# Patient Record
Sex: Female | Born: 1960 | Race: White | Hispanic: No | Smoking: Former smoker
Health system: Southern US, Community
[De-identification: ages and names within clinical notes are randomized; demographics above are authoritative.]

## PROBLEM LIST (undated history)

## (undated) DIAGNOSIS — T7840XA Allergy, unspecified, initial encounter: Secondary | ICD-10-CM

## (undated) DIAGNOSIS — M1990 Unspecified osteoarthritis, unspecified site: Secondary | ICD-10-CM

## (undated) DIAGNOSIS — N200 Calculus of kidney: Secondary | ICD-10-CM

## (undated) DIAGNOSIS — N390 Urinary tract infection, site not specified: Secondary | ICD-10-CM

## (undated) HISTORY — DX: Unspecified osteoarthritis, unspecified site: M19.90

## (undated) HISTORY — DX: Allergy, unspecified, initial encounter: T78.40XA

## (undated) HISTORY — PX: EYE SURGERY: SHX253

---

## 1998-08-27 ENCOUNTER — Ambulatory Visit (HOSPITAL_COMMUNITY): Admission: RE | Admit: 1998-08-27 | Discharge: 1998-08-27 | Payer: Self-pay | Admitting: Family Medicine

## 2000-06-02 ENCOUNTER — Ambulatory Visit (HOSPITAL_COMMUNITY): Admission: RE | Admit: 2000-06-02 | Discharge: 2000-06-02 | Payer: Self-pay | Admitting: Family Medicine

## 2000-06-02 ENCOUNTER — Encounter: Payer: Self-pay | Admitting: Family Medicine

## 2000-07-07 ENCOUNTER — Ambulatory Visit (HOSPITAL_COMMUNITY): Admission: RE | Admit: 2000-07-07 | Discharge: 2000-07-07 | Payer: Self-pay | Admitting: Family Medicine

## 2000-12-17 ENCOUNTER — Encounter: Admission: RE | Admit: 2000-12-17 | Discharge: 2000-12-17 | Payer: Self-pay | Admitting: Obstetrics

## 2001-03-18 ENCOUNTER — Encounter: Admission: RE | Admit: 2001-03-18 | Discharge: 2001-03-18 | Payer: Self-pay | Admitting: Obstetrics

## 2002-04-19 ENCOUNTER — Encounter: Admission: RE | Admit: 2002-04-19 | Discharge: 2002-04-19 | Payer: Self-pay | Admitting: Family Medicine

## 2002-04-19 ENCOUNTER — Encounter: Payer: Self-pay | Admitting: Family Medicine

## 2002-06-23 ENCOUNTER — Other Ambulatory Visit: Admission: RE | Admit: 2002-06-23 | Discharge: 2002-06-23 | Payer: Self-pay | Admitting: Family Medicine

## 2003-10-11 ENCOUNTER — Other Ambulatory Visit: Admission: RE | Admit: 2003-10-11 | Discharge: 2003-10-11 | Payer: Self-pay | Admitting: Family Medicine

## 2003-12-27 ENCOUNTER — Emergency Department (HOSPITAL_COMMUNITY): Admission: EM | Admit: 2003-12-27 | Discharge: 2003-12-27 | Payer: Self-pay | Admitting: Emergency Medicine

## 2004-01-16 ENCOUNTER — Ambulatory Visit: Payer: Self-pay | Admitting: Family Medicine

## 2004-01-25 ENCOUNTER — Ambulatory Visit (HOSPITAL_COMMUNITY): Admission: RE | Admit: 2004-01-25 | Discharge: 2004-01-25 | Payer: Self-pay | Admitting: Family Medicine

## 2004-02-08 ENCOUNTER — Ambulatory Visit: Payer: Self-pay | Admitting: Family Medicine

## 2004-03-31 HISTORY — PX: COSMETIC SURGERY: SHX468

## 2004-04-22 ENCOUNTER — Encounter: Admission: RE | Admit: 2004-04-22 | Discharge: 2004-05-06 | Payer: Self-pay | Admitting: Chiropractic Medicine

## 2005-02-05 ENCOUNTER — Encounter: Admission: RE | Admit: 2005-02-05 | Discharge: 2005-04-29 | Payer: Self-pay | Admitting: Neurosurgery

## 2005-07-22 ENCOUNTER — Encounter: Admission: RE | Admit: 2005-07-22 | Discharge: 2005-08-28 | Payer: Self-pay | Admitting: Neurosurgery

## 2005-09-04 ENCOUNTER — Encounter: Admission: RE | Admit: 2005-09-04 | Discharge: 2005-10-30 | Payer: Self-pay | Admitting: *Deleted

## 2005-09-09 ENCOUNTER — Ambulatory Visit (HOSPITAL_COMMUNITY): Admission: RE | Admit: 2005-09-09 | Discharge: 2005-09-09 | Payer: Self-pay

## 2005-12-24 ENCOUNTER — Ambulatory Visit: Payer: Self-pay | Admitting: Family Medicine

## 2005-12-30 ENCOUNTER — Ambulatory Visit (HOSPITAL_COMMUNITY): Admission: RE | Admit: 2005-12-30 | Discharge: 2005-12-30 | Payer: Self-pay | Admitting: Family Medicine

## 2006-02-17 ENCOUNTER — Ambulatory Visit: Payer: Self-pay | Admitting: Family Medicine

## 2006-03-27 ENCOUNTER — Encounter (INDEPENDENT_AMBULATORY_CARE_PROVIDER_SITE_OTHER): Payer: Self-pay | Admitting: Family Medicine

## 2006-03-27 ENCOUNTER — Ambulatory Visit: Payer: Self-pay | Admitting: Family Medicine

## 2006-12-16 ENCOUNTER — Encounter (INDEPENDENT_AMBULATORY_CARE_PROVIDER_SITE_OTHER): Payer: Self-pay | Admitting: *Deleted

## 2007-09-12 ENCOUNTER — Emergency Department (HOSPITAL_COMMUNITY): Admission: EM | Admit: 2007-09-12 | Discharge: 2007-09-12 | Payer: Self-pay | Admitting: Emergency Medicine

## 2008-12-16 ENCOUNTER — Emergency Department (HOSPITAL_COMMUNITY): Admission: EM | Admit: 2008-12-16 | Discharge: 2008-12-16 | Payer: Self-pay | Admitting: Emergency Medicine

## 2008-12-20 ENCOUNTER — Emergency Department (HOSPITAL_COMMUNITY): Admission: EM | Admit: 2008-12-20 | Discharge: 2008-12-20 | Payer: Self-pay | Admitting: Emergency Medicine

## 2010-09-16 ENCOUNTER — Other Ambulatory Visit: Payer: Self-pay | Admitting: Internal Medicine

## 2010-09-16 DIAGNOSIS — Z1231 Encounter for screening mammogram for malignant neoplasm of breast: Secondary | ICD-10-CM

## 2010-09-17 ENCOUNTER — Ambulatory Visit
Admission: RE | Admit: 2010-09-17 | Discharge: 2010-09-17 | Disposition: A | Discharge status: Home / Self Care | Payer: Self-pay | Source: Ambulatory Visit | Attending: Internal Medicine | Admitting: Internal Medicine

## 2010-09-17 DIAGNOSIS — Z1231 Encounter for screening mammogram for malignant neoplasm of breast: Secondary | ICD-10-CM

## 2010-09-20 ENCOUNTER — Other Ambulatory Visit: Payer: Self-pay | Admitting: Internal Medicine

## 2010-09-20 DIAGNOSIS — R928 Other abnormal and inconclusive findings on diagnostic imaging of breast: Secondary | ICD-10-CM

## 2010-09-27 ENCOUNTER — Ambulatory Visit
Admission: RE | Admit: 2010-09-27 | Discharge: 2010-09-27 | Disposition: A | Discharge status: Home / Self Care | Payer: Self-pay | Source: Ambulatory Visit | Attending: Internal Medicine | Admitting: Internal Medicine

## 2010-09-27 DIAGNOSIS — R928 Other abnormal and inconclusive findings on diagnostic imaging of breast: Secondary | ICD-10-CM

## 2012-06-10 ENCOUNTER — Ambulatory Visit: Payer: BC Managed Care – PPO

## 2012-06-10 ENCOUNTER — Ambulatory Visit (INDEPENDENT_AMBULATORY_CARE_PROVIDER_SITE_OTHER): Payer: BC Managed Care – PPO | Admitting: Family Medicine

## 2012-06-10 VITALS — BP 108/60 | HR 70 | Temp 98.1°F | Resp 16 | Ht 66.0 in | Wt 143.0 lb

## 2012-06-10 DIAGNOSIS — J019 Acute sinusitis, unspecified: Secondary | ICD-10-CM

## 2012-06-10 DIAGNOSIS — M25572 Pain in left ankle and joints of left foot: Secondary | ICD-10-CM

## 2012-06-10 DIAGNOSIS — R180 Malignant ascites: Secondary | ICD-10-CM

## 2012-06-10 DIAGNOSIS — J0100 Acute maxillary sinusitis, unspecified: Secondary | ICD-10-CM

## 2012-06-10 MED ORDER — GUAIFENESIN ER 1200 MG PO TB12: 1.0000 | ORAL_TABLET | Freq: Two times a day (BID) | ORAL | Status: DC

## 2012-06-10 MED ORDER — MELOXICAM 15 MG PO TABS: 15.0000 mg | ORAL_TABLET | Freq: Every day | ORAL | Status: DC

## 2012-06-10 MED ORDER — FLUCONAZOLE 150 MG PO TABS: 150.0000 mg | ORAL_TABLET | Freq: Once | ORAL | Status: DC

## 2012-06-10 MED ORDER — DOXYCYCLINE HYCLATE 100 MG PO TABS: 100.0000 mg | ORAL_TABLET | Freq: Two times a day (BID) | ORAL | Status: DC

## 2012-06-10 NOTE — Progress Notes (Signed)
   [address], [place] [address]   Phone [phone]  Subjective:    Patient ID: Lori Stout, female    DOB: 1960/12/27, 52 y.o.   MRN: [id number]  HPI  Pt presents to clinic with 2 concerns 1- sinus pressure and rhinorrhea for the last 2 wks not getting better with PND and cough with yellow sputum.  No increased cough at night.  Using Vit C without help.  L sided cheek pain and teeth pain.   2- L great toe pain for months.  She has an injury to it as a teenager and then did not have any problems with until about 2 months ago and she did not have an injury.  She has pain with toe moving from side to side and with shoes without a lot of cushion.  She rarely wears heals and has times when it does not hurt at all.  She has taken no medication for the pain.  Review of Systems  Constitutional: Positive for fever (subjective) and chills.  HENT: Positive for rhinorrhea (clear/white) and sinus pressure (L maxillary). Negative for sore throat and postnasal drip.   Respiratory: Positive for cough (yellow). Negative for wheezing.   Gastrointestinal: Negative for nausea, vomiting and diarrhea.  Musculoskeletal: Positive for arthralgias (L great toe).  Neurological: Positive for headaches.       Objective:   Physical Exam  Vitals reviewed. Constitutional: She is oriented to person, place, and time. She appears well-developed and well-nourished.  HENT:  Head: Normocephalic and atraumatic.  Right Ear: Hearing, tympanic membrane, external ear and ear canal normal.  Left Ear: Hearing, tympanic membrane, external ear and ear canal normal.  Nose: Mucosal edema (red) present.  Mouth/Throat: Uvula is midline and oropharynx is clear and moist.  Eyes: Conjunctivae are normal.  Neck: Neck supple.  Cardiovascular: Normal rate and normal heart sounds.   No murmur heard. Pulmonary/Chest: Effort normal. She has no wheezes.  Coarse breath sounds.  Musculoskeletal:       Feet:  Neurological:  She is alert and oriented to person, place, and time.  Skin: Skin is warm and dry.  Psychiatric: She has a normal mood and affect. Her behavior is normal. Judgment and thought content normal.     [facility] reading (PRIMARY) by  Dr. Alwyn Ren.  ? bipartite sesamoid, Irregular area on dorsal aspect of distal 1st metatarsal.       Assessment & Plan:  Sinusitis, acute maxillary - With symptoms for 2 wks and symptoms will cover for sinusitis but due to coarse breath sounds will use abx that also covers for bronchitis due to her smoking history.  Plan: doxycycline (VIBRA-TABS) 100 MG tablet, Guaifenesin (MUCINEX MAXIMUM STRENGTH) 1200 MG TB12, fluconazole (DIFLUCAN) 150 MG tablet  Pain in joint, ankle and foot, left - I think pt might have a very early bunion and possibly capsulitis. Will start NSAIDs and pt instructed to wear good supportive shoes.  If no help will consider referral for orthotics.  I do not think this is gout due to length of time but we were going to run an uric acid to make sure but pt did not want to stay.  Xray does not show significant arthritis.  Plan: DG Foot Complete Left, meloxicam (MOBIC) 15 MG tablet, CANCELED: Uric Acid - pt did not want to stay for her lab work.

## 2012-06-11 ENCOUNTER — Telehealth: Payer: Self-pay

## 2012-06-11 NOTE — Telephone Encounter (Signed)
Patient requesting cold fever blister and would like medication.  She saw Lori Stout yesterday.   [phone]

## 2012-06-11 NOTE — Progress Notes (Signed)
Discussed with Benny Lennert PA and examined briefly and interpreted xray.  Looks like a split sesamoid, not where she is tender.  ?gout. Agree with treatment plan.

## 2012-06-14 NOTE — Telephone Encounter (Signed)
Looks like this was not discussed.  Has pt been on medication for this before?  Let's pull paper chart

## 2012-06-14 NOTE — Telephone Encounter (Signed)
Did you discuss this with her?

## 2012-06-15 NOTE — Telephone Encounter (Signed)
Chart not in storage. DOS 09/06/09 pulled to nurses station.

## 2012-06-15 NOTE — Telephone Encounter (Signed)
This was not discussed at her visit so I cannot write her for meds.

## 2012-06-15 NOTE — Telephone Encounter (Signed)
Last visit 2011, charts are in storage.

## 2012-06-15 NOTE — Telephone Encounter (Signed)
Forward to Ms. Weber, PA-C. Patient seen here previously x 1, 09/06/2009 for a dog bite.  No mention of HSV/fever blister history in notes, no HSV treatment listed on med list. DOS [id number] re-filed as pertinent info is found.

## 2012-06-16 NOTE — Telephone Encounter (Signed)
Patient advised needs office visit for this issue

## 2013-01-07 ENCOUNTER — Emergency Department (HOSPITAL_COMMUNITY): Payer: No Typology Code available for payment source

## 2013-01-07 ENCOUNTER — Inpatient Hospital Stay (HOSPITAL_COMMUNITY)
Admission: EM | Admit: 2013-01-07 | Discharge: 2013-01-09 | DRG: 552 | Disposition: A | Discharge status: Home / Self Care | Payer: No Typology Code available for payment source | Attending: Internal Medicine | Admitting: Internal Medicine

## 2013-01-07 ENCOUNTER — Encounter (HOSPITAL_COMMUNITY): Payer: Self-pay | Admitting: Emergency Medicine

## 2013-01-07 DIAGNOSIS — Z791 Long term (current) use of non-steroidal anti-inflammatories (NSAID): Secondary | ICD-10-CM

## 2013-01-07 DIAGNOSIS — M48061 Spinal stenosis, lumbar region without neurogenic claudication: Principal | ICD-10-CM | POA: Diagnosis present

## 2013-01-07 DIAGNOSIS — Z87442 Personal history of urinary calculi: Secondary | ICD-10-CM

## 2013-01-07 DIAGNOSIS — M549 Dorsalgia, unspecified: Secondary | ICD-10-CM

## 2013-01-07 DIAGNOSIS — N3090 Cystitis, unspecified without hematuria: Secondary | ICD-10-CM

## 2013-01-07 DIAGNOSIS — Z79899 Other long term (current) drug therapy: Secondary | ICD-10-CM

## 2013-01-07 DIAGNOSIS — R109 Unspecified abdominal pain: Secondary | ICD-10-CM

## 2013-01-07 DIAGNOSIS — F17200 Nicotine dependence, unspecified, uncomplicated: Secondary | ICD-10-CM | POA: Diagnosis present

## 2013-01-07 DIAGNOSIS — Z87440 Personal history of urinary (tract) infections: Secondary | ICD-10-CM

## 2013-01-07 DIAGNOSIS — R911 Solitary pulmonary nodule: Secondary | ICD-10-CM

## 2013-01-07 DIAGNOSIS — R319 Hematuria, unspecified: Secondary | ICD-10-CM

## 2013-01-07 HISTORY — DX: Urinary tract infection, site not specified: N39.0

## 2013-01-07 HISTORY — DX: Calculus of kidney: N20.0

## 2013-01-07 LAB — CBC WITH DIFFERENTIAL/PLATELET
Basophils Absolute: 0 10*3/uL (ref 0.0–0.1)
Basophils Relative: 0 % (ref 0–1)
HCT: 36 % (ref 36.0–46.0)
Hemoglobin: 11.9 g/dL — ABNORMAL LOW (ref 12.0–15.0)
MCH: 29.2 pg (ref 26.0–34.0)
MCV: 88.5 fL (ref 78.0–100.0)
Monocytes Absolute: 0.5 10*3/uL (ref 0.1–1.0)
Monocytes Relative: 5 % (ref 3–12)
Neutro Abs: 7.8 10*3/uL — ABNORMAL HIGH (ref 1.7–7.7)
Neutrophils Relative %: 82 % — ABNORMAL HIGH (ref 43–77)
RDW: 12.5 % (ref 11.5–15.5)

## 2013-01-07 LAB — URINALYSIS, ROUTINE W REFLEX MICROSCOPIC
Bilirubin Urine: NEGATIVE
Glucose, UA: NEGATIVE mg/dL
Ketones, ur: 15 mg/dL — AB
Leukocytes, UA: NEGATIVE
Nitrite: NEGATIVE
Protein, ur: NEGATIVE mg/dL
Specific Gravity, Urine: 1.022 (ref 1.005–1.030)
Urobilinogen, UA: 0.2 mg/dL (ref 0.0–1.0)
pH: 7 (ref 5.0–8.0)

## 2013-01-07 LAB — POCT I-STAT, CHEM 8
BUN: 14 mg/dL (ref 6–23)
Calcium, Ion: 1.15 mmol/L (ref 1.12–1.23)
Chloride: 106 mEq/L (ref 96–112)
Creatinine, Ser: 0.9 mg/dL (ref 0.50–1.10)
Glucose, Bld: 112 mg/dL — ABNORMAL HIGH (ref 70–99)
HCT: 41 % (ref 36.0–46.0)
Hemoglobin: 13.9 g/dL (ref 12.0–15.0)
Potassium: 3.9 meq/L (ref 3.5–5.1)
Sodium: 142 meq/L (ref 135–145)
TCO2: 24 mmol/L (ref 0–100)

## 2013-01-07 LAB — URINE MICROSCOPIC-ADD ON

## 2013-01-07 MED ORDER — HYDROCODONE-ACETAMINOPHEN 5-325 MG PO TABS: ORAL_TABLET | ORAL | Status: DC

## 2013-01-07 MED ORDER — SODIUM CHLORIDE 0.9 % IV SOLN: INTRAVENOUS | Status: DC

## 2013-01-07 MED ORDER — CIPROFLOXACIN HCL 500 MG PO TABS
500.0000 mg | ORAL_TABLET | Freq: Once | ORAL | Status: AC
  Administered 2013-01-07: 500 mg via ORAL
  Filled 2013-01-07: qty 1

## 2013-01-07 MED ORDER — HYDROMORPHONE HCL PF 1 MG/ML IJ SOLN
1.0000 mg | Freq: Once | INTRAMUSCULAR | Status: AC
  Administered 2013-01-07: 1 mg via INTRAVENOUS
  Filled 2013-01-07: qty 1

## 2013-01-07 MED ORDER — NAPHAZOLINE HCL 0.1 % OP SOLN: 1.0000 [drp] | Freq: Four times a day (QID) | OPHTHALMIC | Status: DC | PRN

## 2013-01-07 MED ORDER — KETOROLAC TROMETHAMINE 30 MG/ML IJ SOLN
30.0000 mg | Freq: Once | INTRAMUSCULAR | Status: AC
  Administered 2013-01-07: 30 mg via INTRAVENOUS
  Filled 2013-01-07: qty 1

## 2013-01-07 MED ORDER — ACETAMINOPHEN 650 MG RE SUPP: 650.0000 mg | Freq: Four times a day (QID) | RECTAL | Status: DC | PRN

## 2013-01-07 MED ORDER — SODIUM CHLORIDE 0.9 % IV BOLUS (SEPSIS)
500.0000 mL | Freq: Once | INTRAVENOUS | Status: AC
  Administered 2013-01-07: 500 mL via INTRAVENOUS

## 2013-01-07 MED ORDER — MORPHINE SULFATE 2 MG/ML IJ SOLN
1.0000 mg | INTRAMUSCULAR | Status: DC | PRN
  Administered 2013-01-07: 1 mg via INTRAVENOUS
  Filled 2013-01-07: qty 1

## 2013-01-07 MED ORDER — ONDANSETRON HCL 4 MG/2ML IJ SOLN
4.0000 mg | Freq: Once | INTRAMUSCULAR | Status: AC
  Administered 2013-01-07: 4 mg via INTRAVENOUS
  Filled 2013-01-07: qty 2

## 2013-01-07 MED ORDER — DIAZEPAM 5 MG PO TABS: 5.0000 mg | ORAL_TABLET | Freq: Three times a day (TID) | ORAL | Status: DC | PRN

## 2013-01-07 MED ORDER — ONDANSETRON HCL 4 MG PO TABS: 4.0000 mg | ORAL_TABLET | Freq: Four times a day (QID) | ORAL | Status: DC | PRN

## 2013-01-07 MED ORDER — ONDANSETRON HCL 4 MG/2ML IJ SOLN: 4.0000 mg | Freq: Three times a day (TID) | INTRAMUSCULAR | Status: DC | PRN

## 2013-01-07 MED ORDER — CIPROFLOXACIN IN D5W 400 MG/200ML IV SOLN
400.0000 mg | Freq: Two times a day (BID) | INTRAVENOUS | Status: DC
  Administered 2013-01-08 – 2013-01-09 (×3): 400 mg via INTRAVENOUS
  Filled 2013-01-07 (×4): qty 200

## 2013-01-07 MED ORDER — DIAZEPAM 5 MG PO TABS
5.0000 mg | ORAL_TABLET | Freq: Once | ORAL | Status: AC
  Administered 2013-01-07: 5 mg via ORAL
  Filled 2013-01-07: qty 1

## 2013-01-07 MED ORDER — CIPROFLOXACIN HCL 500 MG PO TABS: 500.0000 mg | ORAL_TABLET | Freq: Two times a day (BID) | ORAL | Status: DC

## 2013-01-07 MED ORDER — ONDANSETRON HCL 4 MG/2ML IJ SOLN
4.0000 mg | Freq: Four times a day (QID) | INTRAMUSCULAR | Status: DC | PRN
Start: 2013-01-07 — End: 2013-01-09
  Administered 2013-01-08: 4 mg via INTRAVENOUS
  Filled 2013-01-07: qty 2

## 2013-01-07 MED ORDER — SODIUM CHLORIDE 0.9 % IV SOLN
INTRAVENOUS | Status: DC
  Administered 2013-01-07 – 2013-01-08 (×2): 125 mL/h via INTRAVENOUS

## 2013-01-07 MED ORDER — HYDROMORPHONE HCL PF 1 MG/ML IJ SOLN: 1.0000 mg | INTRAMUSCULAR | Status: DC | PRN

## 2013-01-07 MED ORDER — HYDROCODONE-ACETAMINOPHEN 5-325 MG PO TABS
1.0000 | ORAL_TABLET | ORAL | Status: DC | PRN
  Administered 2013-01-08 (×3): 2 via ORAL
  Administered 2013-01-08: 1 via ORAL
  Administered 2013-01-08 – 2013-01-09 (×6): 2 via ORAL
  Filled 2013-01-07 (×10): qty 2

## 2013-01-07 MED ORDER — ACETAMINOPHEN 325 MG PO TABS: 650.0000 mg | ORAL_TABLET | Freq: Four times a day (QID) | ORAL | Status: DC | PRN

## 2013-01-07 MED ORDER — CIPROFLOXACIN IN D5W 400 MG/200ML IV SOLN
400.0000 mg | Freq: Once | INTRAVENOUS | Status: AC
  Administered 2013-01-07: 400 mg via INTRAVENOUS
  Filled 2013-01-07: qty 200

## 2013-01-07 NOTE — Progress Notes (Signed)
Discussed admission status with Dr. Sunnie Nielsen.

## 2013-01-07 NOTE — ED Notes (Signed)
Bed: ZO10 Expected date:  Expected time:  Means of arrival:  Comments: ems- female, flank pain, hx of kidney stones

## 2013-01-07 NOTE — Progress Notes (Signed)
Pcp is a female pcp at [facility] on [address] [place]

## 2013-01-07 NOTE — ED Notes (Signed)
Per EMS report pt coming from home with c/o left flank and back pain as well as dysuria.  Per EMS pt with hx of kidney stones. IV Fentanyl 100 mcg given en route.

## 2013-01-07 NOTE — ED Notes (Signed)
Pt. Walk to the restroom with son and was unable to go at this time, but she is aware that we need a urine specimen.

## 2013-01-07 NOTE — H&P (Signed)
[facility] History and Physical  SALLEY BOXLEY AVW:[id number] DOB: 01-13-1961 DOA: 01/07/2013  Referring physician: Dr Loma Messing.  PCP: Provider Not In System follow up with PA.  Specialists: none  Chief Complaint: back pain, flank pain.   HPI: Lori Stout is a 52 y.o. female with no significant PMH who presents complaining of back pain, flank pain that started 3 days prior to admission. She describes pain as sharp, 10/10 in intensity. Denies dysuria or fever. She relates nausea. She vomit once in the ED. She got very dizzy when she stands up to go to bathroom. She has not been able to urinates. She is afraid of stands up due to dizziness.   Review of Systems: Negative except as per HPI.   Past Medical History  Diagnosis Date  . Allergy   . Arthritis   . Kidney stone   . Urinary tract infection    Past Surgical History  Procedure Laterality Date  . Cesarean section    . Eye surgery    . Cosmetic surgery  2006    breast implants   Social History:  reports that she has been smoking Cigarettes.  She has a 7.5 pack-year smoking history. She does not have any smokeless tobacco history on file. She reports that she does not drink alcohol or use illicit drugs.   Allergies  Allergen Reactions  . Penicillins Rash    Family History: mother deceased of surgery complications.   Prior to Admission medications   Medication Sig Start Date End Date Taking? Authorizing Provider  ibuprofen (ADVIL,MOTRIN) 200 MG tablet Take 400 mg by mouth every 6 (six) hours as needed for pain.   Yes Historical Provider, MD  tetrahydrozoline 0.05 % ophthalmic solution Place 2 drops into both eyes daily as needed (for dry eyes).   Yes Historical Provider, MD  ciprofloxacin (CIPRO) 500 MG tablet Take 1 tablet (500 mg total) by mouth 2 (two) times daily. 01/07/13   Gavin Pound. Ghim, MD  diazepam (VALIUM) 5 MG tablet Take 1 tablet (5 mg total) by mouth every 8 (eight) hours as needed (muscle  spasms). 01/07/13   Gavin Pound. Ghim, MD  HYDROcodone-acetaminophen (NORCO/VICODIN) 5-325 MG per tablet 1-2 tablets po q 6 hours prn moderate to severe pain 01/07/13   Gavin Pound. Ghim, MD   Physical Exam: Filed Vitals:   01/07/13 1747  BP: 138/67  Pulse: 71  Temp: 98.6 F (37 C)  Resp:    General Appearance:    Alert, cooperative, no distress, appears stated age  Head:    Normocephalic, without obvious abnormality, atraumatic  Eyes:    PERRL, conjunctiva/corneas clear, EOM's intact,      Ears:    Normal TM's and external ear canals, both ears  Nose:   Nares normal, septum midline, mucosa normal, no drainage    or sinus tenderness  Throat:   Lips, mucosa, and tongue normal; teeth and gums normal  Neck:   Supple, symmetrical, trachea midline, no adenopathy;    thyroid:  no enlargement/tenderness/nodules; no carotid   bruit or JVD  Back:     Symmetric, no curvature, ROM normal, left lower back pain.   Lungs:     Clear to auscultation bilaterally, respirations unlabored      Heart:    Regular rate and rhythm, S1 and S2 normal, no murmur, rub   or gallop     Abdomen:     Soft, supra pubic and left side tenderness , bowel sounds active all four  quadrants,    no masses, no organomegaly        Extremities:   Extremities normal, atraumatic, no cyanosis or edema  Pulses:   2+ and symmetric all extremities  Skin:   Skin color, texture, turgor normal, no rashes or lesions  Lymph nodes:   Cervical, supraclavicular, and axillary nodes normal  Neurologic:   CNII-XII intact, normal strength, sensation and reflexes    throughout      Labs on Admission:  Basic Metabolic Panel:  Recent Labs Lab 01/07/13 1214  NA 142  K 3.9  CL 106  GLUCOSE 112*  BUN 14  CREATININE 0.90   Liver Function Tests: No results found for this basename: AST, ALT, ALKPHOS, BILITOT, PROT, ALBUMIN,  in the last 168 hours No results found for this basename: LIPASE, AMYLASE,  in the last 168 hours No results  found for this basename: AMMONIA,  in the last 168 hours CBC:  Recent Labs Lab 01/07/13 1214 01/07/13 1718  WBC  --  9.5  NEUTROABS  --  7.8*  HGB 13.9 11.9*  HCT 41.0 36.0  MCV  --  88.5  PLT  --  218   Cardiac Enzymes: No results found for this basename: CKTOTAL, CKMB, CKMBINDEX, TROPONINI,  in the last 168 hours  BNP (last 3 results) No results found for this basename: PROBNP,  in the last 8760 hours CBG: No results found for this basename: GLUCAP,  in the last 168 hours  Radiological Exams on Admission: Ct Abdomen Pelvis Wo Contrast  01/07/2013   CLINICAL DATA:  Flank pain. Hematuria.  EXAM: CT ABDOMEN AND PELVIS WITHOUT CONTRAST  TECHNIQUE: Multidetector CT imaging of the abdomen and pelvis was performed following the standard protocol without intravenous contrast.  COMPARISON:  None.  FINDINGS: Left 5 mm subpleural nodule is noted. No pericardial effusion. Breast implants are incidentally noted.  The left hepatic lobe demonstrates a 1.8 cm cyst. Medial to this is a 8 mm hypodensity which is too small to characterize but statistically represents a cyst. The gallbladder and pancreas are unremarkable. The spleen is unremarkable. The adrenal glands and right kidney are normal in appearance. The upper pole of the left kidney demonstrates a 3.3 cm cyst. No hydronephrosis or renal calculi. Bladder is normal for the degree of distention. Uterus is present. No adnexal masses are noted.  Bowel loops are normal in caliber without evidence of obstruction. Diverticulosis without evidence of diverticulitis.Normal appendix is visualized. <No ascites, pneumoperitoneum, or lymphadenopathy is seen.  Aorta is normal in caliber.  No acute osseous abnormality or destructive osseous lesion.  IMPRESSION: 1. No hydronephrosis.  No renal, ureter, or bladder calculi.  2. Hepatic cyst and subcentimeter hepatic hypodensity which is too small to characterize, but statistically representing a cyst.  3. Left renal  cyst.  4. Left 5 mm subpleural nodule. If the patient is at high risk for bronchogenic carcinoma, follow-up chest CT at 6-12 months is recommended. If the patient is at low risk for bronchogenic carcinoma, follow-up chest CT at 12 months is recommended. This recommendation follows the consensus statement: Guidelines for Management of Small Pulmonary Nodules Detected on CT Scans: A Statement from the Fleischner Society as published in Radiology 2005;237:395-400.   Electronically Signed   By: Jerene Dilling M.D.   On: 01/07/2013 11:54      Assessment/Plan Active Problems:   Flank pain   Hematuria  1-Flank pain, abdominal pain, hematuria: CT negative for kidney stones. UA with many bacteria, red blood  cell. Differential UTI, Pyelonephritis. She could have passed a stone. Follow urine culture. Continue with ciprofloxacin. Check bladder scan to evaluate for urine retention.   2-Nausea, Vomiting: continue with symptomatic treatments, zofran. Clear diet advance as tolerated. CT abdomen negative  Only show liver cyst, renal cyst.   3-Lung nodule,renal cyst and liver cyst: need follow up imagine outpatient.  4-Dizziness: could be secondary to dehydration, pain medications. IV fluids. Check orthostatics.   Code Status: presume full code. Family Communication: Care discussed with patient.  Disposition Plan: expect overnight admission.   Time spent: 65 minutes.   Alura Olveda [facility] Pager [phone]  If 7PM-7AM, please contact night-coverage [email or web address] Password [email or web address] 01/07/2013, 6:42 PM

## 2013-01-07 NOTE — ED Notes (Signed)
Called to pt's room, pt is now actively vomiting and sts still is unable to stand up or walk around. Dr Rubin Payor notified, new orders received.

## 2013-01-07 NOTE — ED Notes (Signed)
Bladder scan completed: 375 ml

## 2013-01-07 NOTE — ED Notes (Signed)
Pt attempted to go to bathroom, after standing up stated pain was too strong for her to walk. Pt was then helped to bedpan, however, sts not able to urinate. Will notify EDP.

## 2013-01-07 NOTE — ED Notes (Addendum)
Pt at this time sts she will not go home, sts she is talking with her primary doctor right now and she needs to be admitted. Dr Virgina Evener at bedside.

## 2013-01-07 NOTE — ED Notes (Signed)
Pt reports left flank pain that radiates to lower abdomen, sts "it's feel like menstrual cramps"

## 2013-01-07 NOTE — Discharge Instructions (Signed)
 Back Pain, Adult Low back pain is very common. About 1 in 5 people have back pain.The cause of low back pain is rarely dangerous. The pain often gets better over time.About half of people with a sudden onset of back pain feel better in just 2 weeks. About 8 in 10 people feel better by 6 weeks.  CAUSES Some common causes of back pain include:  Strain of the muscles or ligaments supporting the spine.  Wear and tear (degeneration) of the spinal discs.  Arthritis.  Direct injury to the back. DIAGNOSIS Most of the time, the direct cause of low back pain is not known.However, back pain can be treated effectively even when the exact cause of the pain is unknown.Answering your caregiver's questions about your overall health and symptoms is one of the most accurate ways to make sure the cause of your pain is not dangerous. If your caregiver needs more information, he or she may order lab work or imaging tests (X-rays or MRIs).However, even if imaging tests show changes in your back, this usually does not require surgery. HOME CARE INSTRUCTIONS For many people, back pain returns.Since low back pain is rarely dangerous, it is often a condition that people can learn to [facility] their own.   Remain active. It is stressful on the back to sit or stand in one place. Do not sit, drive, or stand in one place for more than 30 minutes at a time. Take short walks on level surfaces as soon as pain allows.Try to increase the length of time you walk each day.  Do not stay in bed.Resting more than 1 or 2 days can delay your recovery.  Do not avoid exercise or work.Your body is made to move.It is not dangerous to be active, even though your back may hurt.Your back will likely heal faster if you return to being active before your pain is gone.  Pay attention to your body when you bend and lift. Many people have less discomfortwhen lifting if they bend their knees, keep the load close to their bodies,and  avoid twisting. Often, the most comfortable positions are those that put less stress on your recovering back.  Find a comfortable position to sleep. Use a firm mattress and lie on your side with your knees slightly bent. If you lie on your back, put a pillow under your knees.  Only take over-the-counter or prescription medicines as directed by your caregiver. Over-the-counter medicines to reduce pain and inflammation are often the most helpful.Your caregiver may prescribe muscle relaxant drugs.These medicines help dull your pain so you can more quickly return to your normal activities and healthy exercise.  Put ice on the injured area.  Put ice in a plastic bag.  Place a towel between your skin and the bag.  Leave the ice on for 15-20 minutes, 3-4 times a day for the first 2 to 3 days. After that, ice and heat may be alternated to reduce pain and spasms.  Ask your caregiver about trying back exercises and gentle massage. This may be of some benefit.  Avoid feeling anxious or stressed.Stress increases muscle tension and can worsen back pain.It is important to recognize when you are anxious or stressed and learn ways to manage it.Exercise is a great option. SEEK MEDICAL CARE IF:  You have pain that is not relieved with rest or medicine.  You have pain that does not improve in 1 week.  You have new symptoms.  You are generally not feeling well. SEEK  IMMEDIATE MEDICAL CARE IF:   You have pain that radiates from your back into your legs.  You develop new bowel or bladder control problems.  You have unusual weakness or numbness in your arms or legs.  You develop nausea or vomiting.  You develop abdominal pain.  You feel faint. Document Released: 03/17/2005 Document Revised: 09/16/2011 Document Reviewed: 08/05/2010 [facility] Patient Information 2014 [place], [place].    Narcotic and benzodiazepine use may cause drowsiness, slowed breathing or dependence.  Please use with  caution and do not drive, operate machinery or watch young children alone while taking them.  Taking combinations of these medications or drinking alcohol will potentiate these effects.    Urinary Tract Infection Urinary tract infections (UTIs) can develop anywhere along your urinary tract. Your urinary tract is your body's drainage system for removing wastes and extra water. Your urinary tract includes two kidneys, two ureters, a bladder, and a urethra. Your kidneys are a pair of bean-shaped organs. Each kidney is about the size of your fist. They are located below your ribs, one on each side of your spine. CAUSES Infections are caused by microbes, which are microscopic organisms, including fungi, viruses, and bacteria. These organisms are so small that they can only be seen through a microscope. Bacteria are the microbes that most commonly cause UTIs. SYMPTOMS  Symptoms of UTIs may vary by age and gender of the patient and by the location of the infection. Symptoms in young women typically include a frequent and intense urge to urinate and a painful, burning feeling in the bladder or urethra during urination. Older women and men are more likely to be tired, shaky, and weak and have muscle aches and abdominal pain. A fever may mean the infection is in your kidneys. Other symptoms of a kidney infection include pain in your back or sides below the ribs, nausea, and vomiting. DIAGNOSIS To diagnose a UTI, your caregiver will ask you about your symptoms. Your caregiver also will ask to provide a urine sample. The urine sample will be tested for bacteria and white blood cells. White blood cells are made by your body to help fight infection. TREATMENT  Typically, UTIs can be treated with medication. Because most UTIs are caused by a bacterial infection, they usually can be treated with the use of antibiotics. The choice of antibiotic and length of treatment depend on your symptoms and the type of bacteria  causing your infection. HOME CARE INSTRUCTIONS  If you were prescribed antibiotics, take them exactly as your caregiver instructs you. Finish the medication even if you feel better after you have only taken some of the medication.  Drink enough water and fluids to keep your urine clear or pale yellow.  Avoid caffeine, tea, and carbonated beverages. They tend to irritate your bladder.  Empty your bladder often. Avoid holding urine for long periods of time.  Empty your bladder before and after sexual intercourse.  After a bowel movement, women should cleanse from front to back. Use each tissue only once. SEEK MEDICAL CARE IF:   You have back pain.  You develop a fever.  Your symptoms do not begin to resolve within 3 days. SEEK IMMEDIATE MEDICAL CARE IF:   You have severe back pain or lower abdominal pain.  You develop chills.  You have nausea or vomiting.  You have continued burning or discomfort with urination. MAKE SURE YOU:   Understand these instructions.  Will watch your condition.  Will get help right away if you  are not doing well or get worse. Document Released: 12/25/2004 Document Revised: 09/16/2011 Document Reviewed: 04/25/2011 [facility] Patient Information 2014 [place], [place].

## 2013-01-07 NOTE — ED Provider Notes (Signed)
CSN: [id number]     Arrival date & time 01/07/13  1011 History   First MD Initiated Contact with Patient 01/07/13 1049     Chief Complaint  Patient presents with  . Flank Pain   (Consider location/radiation/quality/duration/timing/severity/associated sxs/prior Treatment) HPI Comments: Pt had a kidney stone at age 52, cannot recall if this is similar.  Pt did have hematuria seen at PMD office last 2 visits over the past several weeks, but was not put on abx.  She denies urinary frequency, no dysuria, burning or pain.  No N/V/D, no fevers, chills.  No CP, SOB, cough.  Pain is worse in left flank and does not radiate to back or to abdomen.  Denies constipation or diarrhea. Pt received IV fentanyl by EMS and feels improved.     Patient is a 52 y.o. female presenting with flank pain. The history is provided by the patient.  Flank Pain This is a new problem. The current episode started more than 2 days ago. The problem occurs constantly. The problem has been gradually worsening. Pertinent negatives include no abdominal pain and no shortness of breath. Exacerbated by: yesterday was worse sitting, now cannto lay on rleft side. The symptoms are relieved by NSAIDs and rest. She has tried rest for the symptoms. The treatment provided moderate relief.    Past Medical History  Diagnosis Date  . Allergy   . Arthritis   . Kidney stone   . Urinary tract infection    Past Surgical History  Procedure Laterality Date  . Cesarean section    . Eye surgery    . Cosmetic surgery  2006    breast implants   History reviewed. No pertinent family history. History  Substance Use Topics  . Smoking status: Current Every Day Smoker -- 0.25 packs/day for 30 years    Types: Cigarettes  . Smokeless tobacco: Not on file  . Alcohol Use: No   OB History   Grav Para Term Preterm Abortions TAB SAB Ect Mult Living                 Review of Systems  Constitutional: Negative for fever, chills and appetite change.    Respiratory: Negative for shortness of breath.   Gastrointestinal: Negative for nausea, vomiting and abdominal pain.  Genitourinary: Positive for hematuria and flank pain. Negative for dysuria and difficulty urinating.  Musculoskeletal: Positive for back pain.  All other systems reviewed and are negative.    Allergies  Penicillins  Home Medications   Current Outpatient Rx  Name  Route  Sig  Dispense  Refill  . ibuprofen (ADVIL,MOTRIN) 200 MG tablet   Oral   Take 400 mg by mouth every 6 (six) hours as needed for pain.         Marland Kitchen tetrahydrozoline 0.05 % ophthalmic solution   Both Eyes   Place 2 drops into both eyes daily as needed (for dry eyes).         . ciprofloxacin (CIPRO) 500 MG tablet   Oral   Take 1 tablet (500 mg total) by mouth 2 (two) times daily.   20 tablet   0   . diazepam (VALIUM) 5 MG tablet   Oral   Take 1 tablet (5 mg total) by mouth every 8 (eight) hours as needed (muscle spasms).   12 tablet   0   . HYDROcodone-acetaminophen (NORCO/VICODIN) 5-325 MG per tablet      1-2 tablets po q 6 hours prn moderate to severe pain  20 tablet   0    BP 140/65  Pulse 81  Temp(Src) 97.9 F (36.6 C) (Oral)  Resp 16  SpO2 97%  LMP 09/16/2010 Physical Exam  Nursing note and vitals reviewed. Constitutional: She is oriented to person, place, and time. She appears well-developed and well-nourished. She is cooperative.  Non-toxic appearance. No distress.  Eyes: Conjunctivae and EOM are normal. No scleral icterus.  Neck: Normal range of motion. Neck supple.  Cardiovascular: Normal rate, regular rhythm and intact distal pulses.   No murmur heard. Pulmonary/Chest: Effort normal and breath sounds normal. No respiratory distress. She has no wheezes.  Abdominal: Soft. Normal appearance. She exhibits no distension. There is no tenderness. There is CVA tenderness. There is no rebound and no guarding.  Musculoskeletal:       Back:  Neurological: She is alert and  oriented to person, place, and time. She exhibits normal muscle tone. Coordination normal.  Skin: Skin is warm and dry. No rash noted. She is not diaphoretic. No pallor.  Psychiatric: She has a normal mood and affect.    ED Course  Procedures (including critical care time) Labs Review Labs Reviewed  URINALYSIS, ROUTINE W REFLEX MICROSCOPIC - Abnormal; Notable for the following:    APPearance TURBID (*)    Hgb urine dipstick LARGE (*)    Ketones, ur 15 (*)    All other components within normal limits  URINE MICROSCOPIC-ADD ON - Abnormal; Notable for the following:    Bacteria, UA MANY (*)    All other components within normal limits  POCT I-STAT, CHEM 8 - Abnormal; Notable for the following:    Glucose, Bld 112 (*)    All other components within normal limits  URINE CULTURE   Imaging Review Ct Abdomen Pelvis Wo Contrast  01/07/2013   CLINICAL DATA:  Flank pain. Hematuria.  EXAM: CT ABDOMEN AND PELVIS WITHOUT CONTRAST  TECHNIQUE: Multidetector CT imaging of the abdomen and pelvis was performed following the standard protocol without intravenous contrast.  COMPARISON:  None.  FINDINGS: Left 5 mm subpleural nodule is noted. No pericardial effusion. Breast implants are incidentally noted.  The left hepatic lobe demonstrates a 1.8 cm cyst. Medial to this is a 8 mm hypodensity which is too small to characterize but statistically represents a cyst. The gallbladder and pancreas are unremarkable. The spleen is unremarkable. The adrenal glands and right kidney are normal in appearance. The upper pole of the left kidney demonstrates a 3.3 cm cyst. No hydronephrosis or renal calculi. Bladder is normal for the degree of distention. Uterus is present. No adnexal masses are noted.  Bowel loops are normal in caliber without evidence of obstruction. Diverticulosis without evidence of diverticulitis.Normal appendix is visualized. <No ascites, pneumoperitoneum, or lymphadenopathy is seen.  Aorta is normal in  caliber.  No acute osseous abnormality or destructive osseous lesion.  IMPRESSION: 1. No hydronephrosis.  No renal, ureter, or bladder calculi.  2. Hepatic cyst and subcentimeter hepatic hypodensity which is too small to characterize, but statistically representing a cyst.  3. Left renal cyst.  4. Left 5 mm subpleural nodule. If the patient is at high risk for bronchogenic carcinoma, follow-up chest CT at 6-12 months is recommended. If the patient is at low risk for bronchogenic carcinoma, follow-up chest CT at 12 months is recommended. This recommendation follows the consensus statement: Guidelines for Management of Small Pulmonary Nodules Detected on CT Scans: A Statement from the Fleischner Society as published in Radiology 2005;237:395-400.   Electronically Signed  By: Jerene Dilling M.D.   On: 01/07/2013 11:54    EKG Interpretation   None      ra sat is 95% and I interpret to be adequate  12:09 PM I reviewed CT scan.  No inflammation around kidney.  Incidental cyst noted, no stones, no hydroureter.  Likely not ureteral colic.  UA is still pending.  Will need outpt follow up for pulmonary nodule.  Pt is a smoker thus have advised follow up CT in 6 months. Pt is more comfortable.     2:14 PM Pt has attempted several times, unable to urinate.   Will get cath.  Pt's pain is improved, able to tolerate ice chips, fluids.  Given difficulty, and no ureteral stone, would treat for cystitis, esp with her prior history and recent history by her report of hematuria.   3:12 PM Pt continues to feel only marginally improved.  Will give another dose of analgesic along with valium for muscle spasms as well.  Also will give Cipro for presumed UTI.     MDM   1. Back pain   2. Cystitis      Pt with some left flank pain for 3 days, much worse this AM.  Pt ahs had asymptomatic hematuria over the past few weeks by her history.  Will treat pain, give NSAID and perform non contrast CT to assess for  ureteral stone.     Gavin Pound. Darrius Montano, MD 01/07/13 [phone]

## 2013-01-07 NOTE — Progress Notes (Signed)
Utilization Review completed.  Lanna Labella RN CM  

## 2013-01-08 DIAGNOSIS — R911 Solitary pulmonary nodule: Secondary | ICD-10-CM

## 2013-01-08 LAB — BASIC METABOLIC PANEL
BUN: 10 mg/dL (ref 6–23)
CO2: 26 mEq/L (ref 19–32)
Calcium: 8.5 mg/dL (ref 8.4–10.5)
Chloride: 108 mEq/L (ref 96–112)
Creatinine, Ser: 0.77 mg/dL (ref 0.50–1.10)
GFR calc non Af Amer: >90 mL/min (ref >90)
Glucose, Bld: 103 mg/dL — ABNORMAL HIGH (ref 70–99)
Sodium: 140 mEq/L (ref 135–145)

## 2013-01-08 LAB — CBC
HCT: 34 % — ABNORMAL LOW (ref 36.0–46.0)
MCH: 29.1 pg (ref 26.0–34.0)
MCHC: 32.6 g/dL (ref 30.0–36.0)
MCV: 89 fL (ref 78.0–100.0)
Platelets: 206 10*3/uL (ref 150–400)
RBC: 3.82 MIL/uL — ABNORMAL LOW (ref 3.87–5.11)

## 2013-01-08 LAB — URINE CULTURE
Colony Count: NO GROWTH
Culture: NO GROWTH

## 2013-01-08 NOTE — Progress Notes (Signed)
[facility] PROGRESS NOTE  Lori Stout ZOX:[id number] DOB: 1960/04/21 DOA: 01/07/2013 PCP: Provider Not In System  Assessment/Plan: 1. L flank/abd pain and hematuria -suspect she may have passed small stone or gravel -continue IVF, advance diet, empiric cipro -CT benign, no evidence of calculi or hydronephrosis, but was non contrast -home later today or in am  2. 5mm lung nodule, FU with Pulm with CT  Code Status: Full Family Communication: none at bedside Disposition Plan: Home later today or in am  Antibiotics:  ceftraixone  HPI/Subjective: Still having some pain, in L flank, no further vomiting   Objective: Filed Vitals:   01/08/13 1527  BP: 125/81  Pulse: 75  Temp: 98 F (36.7 C)  Resp: 16    Intake/Output Summary (Last 24 hours) at 01/08/13 1640 Last data filed at 01/08/13 1527  Gross per 24 hour  Intake 2087.91 ml  Output   2550 ml  Net -462.09 ml   There were no vitals filed for this visit.  Exam:   General: AAOx3  Cardiovascular: S1s2/RRR  Respiratory:CTAB  Abdomen: soft, NT, BS present, no CVA tenderness  Musculoskeletal: no edma c/c   Data Reviewed: Basic Metabolic Panel:  Recent Labs Lab 01/07/13 1214 01/08/13 0530  NA 142 140  K 3.9 3.6  CL 106 108  CO2  --  26  GLUCOSE 112* 103*  BUN 14 10  CREATININE 0.90 0.77  CALCIUM  --  8.5   Liver Function Tests: No results found for this basename: AST, ALT, ALKPHOS, BILITOT, PROT, ALBUMIN,  in the last 168 hours No results found for this basename: LIPASE, AMYLASE,  in the last 168 hours No results found for this basename: AMMONIA,  in the last 168 hours CBC:  Recent Labs Lab 01/07/13 1214 01/07/13 1718 01/08/13 0530  WBC  --  9.5 8.9  NEUTROABS  --  7.8*  --   HGB 13.9 11.9* 11.1*  HCT 41.0 36.0 34.0*  MCV  --  88.5 89.0  PLT  --  218 206   Cardiac Enzymes: No results found for this basename: CKTOTAL, CKMB, CKMBINDEX, TROPONINI,  in the last 168 hours BNP  (last 3 results) No results found for this basename: PROBNP,  in the last 8760 hours CBG: No results found for this basename: GLUCAP,  in the last 168 hours  No results found for this or any previous visit (from the past 240 hour(s)).   Studies: Ct Abdomen Pelvis Wo Contrast  01/07/2013   CLINICAL DATA:  Flank pain. Hematuria.  EXAM: CT ABDOMEN AND PELVIS WITHOUT CONTRAST  TECHNIQUE: Multidetector CT imaging of the abdomen and pelvis was performed following the standard protocol without intravenous contrast.  COMPARISON:  None.  FINDINGS: Left 5 mm subpleural nodule is noted. No pericardial effusion. Breast implants are incidentally noted.  The left hepatic lobe demonstrates a 1.8 cm cyst. Medial to this is a 8 mm hypodensity which is too small to characterize but statistically represents a cyst. The gallbladder and pancreas are unremarkable. The spleen is unremarkable. The adrenal glands and right kidney are normal in appearance. The upper pole of the left kidney demonstrates a 3.3 cm cyst. No hydronephrosis or renal calculi. Bladder is normal for the degree of distention. Uterus is present. No adnexal masses are noted.  Bowel loops are normal in caliber without evidence of obstruction. Diverticulosis without evidence of diverticulitis.Normal appendix is visualized. <No ascites, pneumoperitoneum, or lymphadenopathy is seen.  Aorta is normal in caliber.  No acute osseous  abnormality or destructive osseous lesion.  IMPRESSION: 1. No hydronephrosis.  No renal, ureter, or bladder calculi.  2. Hepatic cyst and subcentimeter hepatic hypodensity which is too small to characterize, but statistically representing a cyst.  3. Left renal cyst.  4. Left 5 mm subpleural nodule. If the patient is at high risk for bronchogenic carcinoma, follow-up chest CT at 6-12 months is recommended. If the patient is at low risk for bronchogenic carcinoma, follow-up chest CT at 12 months is recommended. This recommendation follows  the consensus statement: Guidelines for Management of Small Pulmonary Nodules Detected on CT Scans: A Statement from the Fleischner Society as published in Radiology 2005;237:395-400.   Electronically Signed   By: Jerene Dilling M.D.   On: 01/07/2013 11:54    Scheduled Meds: . ciprofloxacin  400 mg Intravenous Q12H   Continuous Infusions: . sodium chloride 125 mL/hr (01/08/13 0602)    Active Problems:   Flank pain   Hematuria    Time spent:    [facility]  [facility] Pager [phone]. If 7PM-7AM, please contact night-coverage at [email or web address], password [facility] 01/08/2013, 4:40 PM  LOS: 1 day

## 2013-01-09 ENCOUNTER — Inpatient Hospital Stay (HOSPITAL_COMMUNITY): Payer: No Typology Code available for payment source

## 2013-01-09 DIAGNOSIS — M48061 Spinal stenosis, lumbar region without neurogenic claudication: Principal | ICD-10-CM

## 2013-01-09 MED ORDER — HYDROCODONE-ACETAMINOPHEN 5-325 MG PO TABS: ORAL_TABLET | ORAL | Status: AC

## 2013-01-09 MED ORDER — MORPHINE SULFATE 2 MG/ML IJ SOLN
1.0000 mg | INTRAMUSCULAR | Status: DC | PRN
  Administered 2013-01-09: 1 mg via INTRAVENOUS
  Administered 2013-01-09: 2 mg via INTRAVENOUS
  Filled 2013-01-09 (×2): qty 1

## 2013-01-09 MED ORDER — NAPROXEN 375 MG PO TABS: 375.0000 mg | ORAL_TABLET | Freq: Two times a day (BID) | ORAL | Status: AC

## 2013-01-09 MED ORDER — CIPROFLOXACIN HCL 500 MG PO TABS
500.0000 mg | ORAL_TABLET | Freq: Two times a day (BID) | ORAL | Status: DC
  Filled 2013-01-09 (×2): qty 1

## 2013-01-09 MED ORDER — UNABLE TO FIND: Status: AC

## 2013-01-09 MED ORDER — ALUM & MAG HYDROXIDE-SIMETH 200-200-20 MG/5ML PO SUSP
15.0000 mL | Freq: Four times a day (QID) | ORAL | Status: DC | PRN
  Administered 2013-01-09: 11:00:00 via ORAL
  Filled 2013-01-09: qty 30

## 2013-01-19 NOTE — Discharge Summary (Signed)
Physician Discharge Summary  Lori Stout XBJ:[id number] DOB: 01-07-1961 DOA: 01/07/2013  PCP: Provider Not In System  Admit date: 01/07/2013 Discharge date: 01/09/2013  Time spent:  Recommendations for Outpatient Follow-up:  1. Dr.Elsner in 1-2 months 2. Outpatient Physical therapy 3. FU CT chest in 6-12 months to FU 5mm Lung nodule  Discharge Diagnoses:  Active Problems:   Low back pain   Hematuria   Spinal stenosis of lumbar region   H/o renal calculi   Pulmonary nodule   Discharge Condition: stable  Diet recommendation: regular  There were no vitals filed for this visit.  History of present illness:  Lori Stout is a 52 y.o. female with no significant PMH who presents complaining of back pain, flank pain that started 3 days prior to admission. She describes pain as sharp, 10/10 in intensity. Denies dysuria or fever. She relates nausea. She vomit once in the ED. She got very dizzy when she stands up to go to bathroom. She has not been able to urinates. She is afraid of stands up due to dizziness   Hospital Course:  1. L flank/Back pain and hematuria -Initially suspected to have passed a small ureteral stone or gravel, but CT abd normal. -subsequently noted to have pain radiating down her back to her legs with ambulation concerning for spinal stenosis and hence had MRI LS spine, which showed progressive degenerative changes with spinal stenosis at multiple levels, i d/w Neurosurgery on call who recommended pain control, PT and outpatient follow up as needed. She is discharged home on NSAIDs for few days and Vicodin PRN and prescription for outpatient PT  2. 5mm lung nodule, FU with Pulm with CT      Discharge Exam: Filed Vitals:   01/09/13 0637  BP: 133/78  Pulse: 65  Temp: 97.8 F (36.6 C)  Resp: 18    General: AAOx3 Cardiovascular: S1S2/RRR Respiratory: CTAB  Discharge Instructions     Medication List    STOP taking these  medications       ibuprofen 200 MG tablet  Commonly known as:  ADVIL,MOTRIN      TAKE these medications       HYDROcodone-acetaminophen 5-325 MG per tablet  Commonly known as:  NORCO/VICODIN  1-2 tablets po q 6 hours prn moderate to severe pain     naproxen 375 MG tablet  Commonly known as:  NAPROSYN  Take 1 tablet (375 mg total) by mouth 2 (two) times daily with a meal. For 1 week     tetrahydrozoline 0.05 % ophthalmic solution  Place 2 drops into both eyes daily as needed (for dry eyes).     UNABLE TO FIND  Physical Therapy for Spinal stenosis       Allergies  Allergen Reactions  . Penicillins Rash       Follow-up Information   Follow up with HOPPER,DAVID, MD. Schedule an appointment as soon as possible for a visit in 1 week.   Specialty:  Family Medicine   Contact information:   [address] [place] [address] [phone]       Follow up with Stefani Dama, MD. Schedule an appointment as soon as possible for a visit in 2 months.   Specialty:  Neurosurgery   Contact information:   [address] [place] [address] [phone]       Follow up with FU Ct in 6-12 months. (for Pulmonary nodule)        The results of significant  diagnostics from this hospitalization (including imaging, microbiology, ancillary and laboratory) are listed below for reference.    Significant Diagnostic Studies: Ct Abdomen Pelvis Wo Contrast  01/07/2013   CLINICAL DATA:  Flank pain. Hematuria.  EXAM: CT ABDOMEN AND PELVIS WITHOUT CONTRAST  TECHNIQUE: Multidetector CT imaging of the abdomen and pelvis was performed following the standard protocol without intravenous contrast.  COMPARISON:  None.  FINDINGS: Left 5 mm subpleural nodule is noted. No pericardial effusion. Breast implants are incidentally noted.  The left hepatic lobe demonstrates a 1.8 cm cyst. Medial to this is a 8 mm hypodensity which is too small to characterize but statistically represents a  cyst. The gallbladder and pancreas are unremarkable. The spleen is unremarkable. The adrenal glands and right kidney are normal in appearance. The upper pole of the left kidney demonstrates a 3.3 cm cyst. No hydronephrosis or renal calculi. Bladder is normal for the degree of distention. Uterus is present. No adnexal masses are noted.  Bowel loops are normal in caliber without evidence of obstruction. Diverticulosis without evidence of diverticulitis.Normal appendix is visualized. <No ascites, pneumoperitoneum, or lymphadenopathy is seen.  Aorta is normal in caliber.  No acute osseous abnormality or destructive osseous lesion.  IMPRESSION: 1. No hydronephrosis.  No renal, ureter, or bladder calculi.  2. Hepatic cyst and subcentimeter hepatic hypodensity which is too small to characterize, but statistically representing a cyst.  3. Left renal cyst.  4. Left 5 mm subpleural nodule. If the patient is at high risk for bronchogenic carcinoma, follow-up chest CT at 6-12 months is recommended. If the patient is at low risk for bronchogenic carcinoma, follow-up chest CT at 12 months is recommended. This recommendation follows the consensus statement: Guidelines for Management of Small Pulmonary Nodules Detected on CT Scans: A Statement from the Fleischner Society as published in Radiology 2005;237:395-400.   Electronically Signed   By: Jerene Dilling M.D.   On: 01/07/2013 11:54   Mr Lumbar Spine Wo Contrast  01/09/2013   CLINICAL DATA:  Left-sided pain and soreness lower back to touch.  EXAM: MRI LUMBAR SPINE WITHOUT CONTRAST  TECHNIQUE: Multiplanar, multisequence MR imaging was performed. No intravenous contrast was administered.  COMPARISON:  05/09/2004 MR. 01/07/2013 CT abdomen and pelvis.  FINDINGS: Last fully open disk space is labeled L5-S1. Present examination incorporates from T11-12 disc space through the lower sacrum.  Liver cyst and renal cyst incompletely assessed on the present exam.  Conus just below  the T12-L1 disc space.  T11-12 through L2-3 unremarkable.  L3-4: Moderate disc degeneration with Schmorl's node deformity and mild endplate reactive changes. Probable slightly atypical hemangioma left aspect L3 vertebra unchanged. Mild to moderate bulge. Mild facet joint degenerative changes. Mild spinal stenosis and very mild bilateral foraminal narrowing.  L4-5: Prominent disc degeneration endplate reactive changes. Anterior osteophyte. Facet joint degenerative changes. Moderate bulge/ broad-based protrusion. Baseline mild spinal stenosis and mild to moderate bilateral foraminal narrowing. Superimposed small left posterior lateral caudally extending disk protrusion causes slight impression on the left ventral aspect of the thecal sac. On the recent CT, this appears to contain gas. Lateral extension of disc greater to the right with mild encroachment upon the exiting right L4 nerve root.  L5-S1: Mild to moderate facet joint degenerative changes. Shallow broad-based protrusion right posterior lateral/ foraminal position. Mild impression upon the right S1 nerve root. Additionally, there is a small extruded right posterior lateral caudally extending disk fragment further compressing the right S1 nerve root.  IMPRESSION: Progressive degenerative changes since the  2006 MR. Summary of pertinent findings include:  L3-4 mild spinal stenosis and very mild bilateral foraminal narrowing.  L4-5 baseline mild spinal stenosis and mild to moderate bilateral foraminal narrowing. Superimposed small left posterior lateral caudally extending disk protrusion causes slight impression on the left ventral aspect of the thecal sac. Lateral extension of disc greater to the right with mild encroachment upon the exiting right L4 nerve root.  L5-S1 shallow broad-based protrusion right posterior lateral/ foraminal position. Mild impression upon the right S1 nerve root. Additionally, there is a small extruded right posterior lateral caudally  extending disk fragment further compressing the right S1 nerve root.   Electronically Signed   By: Bridgett Larsson M.D.   On: 01/09/2013 13:29    Microbiology: No results found for this or any previous visit (from the past 240 hour(s)).   Labs: Basic Metabolic Panel: No results found for this basename: NA, K, CL, CO2, GLUCOSE, BUN, CREATININE, CALCIUM, MG, PHOS,  in the last 168 hours Liver Function Tests: No results found for this basename: AST, ALT, ALKPHOS, BILITOT, PROT, ALBUMIN,  in the last 168 hours No results found for this basename: LIPASE, AMYLASE,  in the last 168 hours No results found for this basename: AMMONIA,  in the last 168 hours CBC: No results found for this basename: WBC, NEUTROABS, HGB, HCT, MCV, PLT,  in the last 168 hours Cardiac Enzymes: No results found for this basename: CKTOTAL, CKMB, CKMBINDEX, TROPONINI,  in the last 168 hours BNP: BNP (last 3 results) No results found for this basename: PROBNP,  in the last 8760 hours CBG: No results found for this basename: GLUCAP,  in the last 168 hours     Signed:  Lannah Koike  [facility] 01/19/2013, 2:59 PM

## 2013-03-22 ENCOUNTER — Ambulatory Visit: Payer: No Typology Code available for payment source | Attending: Family Medicine | Admitting: Physical Therapy

## 2013-03-22 DIAGNOSIS — IMO0001 Reserved for inherently not codable concepts without codable children: Secondary | ICD-10-CM | POA: Insufficient documentation

## 2013-03-22 DIAGNOSIS — M542 Cervicalgia: Secondary | ICD-10-CM | POA: Insufficient documentation

## 2013-03-22 DIAGNOSIS — M545 Low back pain, unspecified: Secondary | ICD-10-CM | POA: Insufficient documentation

## 2013-03-23 ENCOUNTER — Ambulatory Visit: Payer: No Typology Code available for payment source | Admitting: Physical Therapy

## 2013-03-23 DIAGNOSIS — IMO0001 Reserved for inherently not codable concepts without codable children: Secondary | ICD-10-CM | POA: Diagnosis not present

## 2013-03-28 ENCOUNTER — Ambulatory Visit: Payer: No Typology Code available for payment source | Admitting: Physical Therapy

## 2013-03-28 DIAGNOSIS — IMO0001 Reserved for inherently not codable concepts without codable children: Secondary | ICD-10-CM | POA: Diagnosis not present

## 2013-03-29 ENCOUNTER — Ambulatory Visit: Payer: No Typology Code available for payment source | Admitting: Physical Therapy

## 2013-03-29 DIAGNOSIS — IMO0001 Reserved for inherently not codable concepts without codable children: Secondary | ICD-10-CM | POA: Diagnosis not present

## 2013-03-30 ENCOUNTER — Ambulatory Visit: Payer: No Typology Code available for payment source | Admitting: Physical Therapy

## 2013-03-30 DIAGNOSIS — IMO0001 Reserved for inherently not codable concepts without codable children: Secondary | ICD-10-CM | POA: Diagnosis not present

## 2015-09-05 ENCOUNTER — Other Ambulatory Visit: Payer: Self-pay | Admitting: Neurological Surgery

## 2015-09-05 DIAGNOSIS — M542 Cervicalgia: Secondary | ICD-10-CM

## 2015-09-13 ENCOUNTER — Other Ambulatory Visit: Payer: No Typology Code available for payment source

## 2018-09-14 ENCOUNTER — Telehealth: Payer: Self-pay | Admitting: General Practice

## 2018-09-14 NOTE — Telephone Encounter (Signed)
Called pt to inform her that a new patient appt could not be scheduled until Late July or Early August. Pt also believes elbow was broken and needed an xray. Urgent Care was reccommened.

## 2018-09-15 ENCOUNTER — Ambulatory Visit (INDEPENDENT_AMBULATORY_CARE_PROVIDER_SITE_OTHER): Payer: Self-pay

## 2018-09-15 ENCOUNTER — Other Ambulatory Visit: Payer: Self-pay

## 2018-09-15 ENCOUNTER — Ambulatory Visit
Admission: EM | Admit: 2018-09-15 | Discharge: 2018-09-15 | Disposition: A | Discharge status: Home / Self Care | Payer: Self-pay | Attending: Physician Assistant | Admitting: Physician Assistant

## 2018-09-15 ENCOUNTER — Encounter: Payer: Self-pay | Admitting: Emergency Medicine

## 2018-09-15 DIAGNOSIS — M25522 Pain in left elbow: Secondary | ICD-10-CM

## 2018-09-15 MED ORDER — PREDNISONE 50 MG PO TABS: 50.0000 mg | ORAL_TABLET | Freq: Every day | ORAL | 0 refills | Status: AC

## 2018-09-15 NOTE — Discharge Instructions (Signed)
Xray negative for fracture or dislocation. Start prednisone as directed. Ice compress. Follow up with PCP/orthopedics if symptoms not improving.

## 2018-09-15 NOTE — ED Notes (Signed)
Patient able to ambulate independently  

## 2018-09-15 NOTE — ED Triage Notes (Signed)
Pt presents to [facility] for assessment of left elbow pain after experiencing an injury on 3/1.  Patient states pain is not improving, states pain all the time but much worse with lifting.  Also c/o some hand numbness.

## 2018-09-15 NOTE — ED Provider Notes (Signed)
[facility]    CSN: [id number] Arrival date & time: 09/15/18  1630     History   Chief Complaint Chief Complaint  Patient presents with  . Arm Pain    HPI Lori Stout is a 58 y.o. female.   58 year old female comes in for left elbow pain for the past 3 months. Patient states got injured 3 months ago, though will not elaborate on how she was injured. She had mentioned "hand bent to the back" to triage nurse, but then mentioned tripping and falling with this provider. Stated "I don't know how I injured it". However, requesting xray.  She has pain to the lateral elbow that is worse with lifting. Has numbness/tingling to the fingers. Denies swelling to the area. Has not taken anything for the symptoms.      Past Medical History:  Diagnosis Date  . Allergy   . Arthritis   . Kidney stone   . Urinary tract infection     Patient Active Problem List   Diagnosis Date Noted  . Spinal stenosis of lumbar region 01/09/2013  . Flank pain 01/07/2013  . Hematuria 01/07/2013    Past Surgical History:  Procedure Laterality Date  . CESAREAN SECTION    . COSMETIC SURGERY  2006   breast implants  . EYE SURGERY      OB History   No obstetric history on file.      Home Medications    Prior to Admission medications   Medication Sig Start Date End Date Taking? Authorizing Provider  HYDROcodone-acetaminophen (NORCO/VICODIN) 5-325 MG per tablet 1-2 tablets po q 6 hours prn moderate to severe pain 01/09/13   Domenic Polite, MD  naproxen (NAPROSYN) 375 MG tablet Take 1 tablet (375 mg total) by mouth 2 (two) times daily with a meal. For 1 week 01/09/13   Domenic Polite, MD  predniSONE (DELTASONE) 50 MG tablet Take 1 tablet (50 mg total) by mouth daily with breakfast. 09/15/18   Tasia Catchings, Tessa Seaberry V, PA-C  tetrahydrozoline 0.05 % ophthalmic solution Place 2 drops into both eyes daily as needed (for dry eyes).    [provider]  UNABLE TO FIND Physical Therapy for  Spinal stenosis 01/09/13   Domenic Polite, MD    Family History History reviewed. No pertinent family history.  Social History Social History   Tobacco Use  . Smoking status: Former Smoker    Packs/day: 0.25    Years: 30.00    Pack years: 7.50    Types: Cigarettes    Quit date: 09/15/2014    Years since quitting: 4.0  . Smokeless tobacco: Never Used  Substance Use Topics  . Alcohol use: No  . Drug use: No     Allergies   Penicillins   Review of Systems Review of Systems  Reason unable to perform ROS: See HPI as above.     Physical Exam Triage Vital Signs ED Triage Vitals [09/15/18 1637]  Enc Vitals Group     BP 119/75     Pulse Rate 71     Resp 18     Temp 98.3 F (36.8 C)     Temp Source Oral     SpO2 96 %     Weight      Height      Head Circumference      Peak Flow      Pain Score 9     Pain Loc      Pain Edu?  Excl. in GC?    No data found.  Updated Vital Signs BP 119/75 (BP Location: Left Arm)   Pulse 71   Temp 98.3 F (36.8 C) (Oral)   Resp 18   LMP 09/16/2010   SpO2 96%   Physical Exam Constitutional:      General: She is not in acute distress.    Appearance: She is well-developed. She is not diaphoretic.  HENT:     Head: Normocephalic and atraumatic.  Eyes:     Conjunctiva/sclera: Conjunctivae normal.     Pupils: Pupils are equal, round, and reactive to light.  Musculoskeletal:     Comments: No swelling, erythema, warmth, contusion seen. Tenderness to palpation of lateral elbow. Full ROM of shoulder, elbow, wrist. Strength normal and equal bilaterally. Normal grip strength. Sensation intact and equal bilaterally. Radial pulse 2+, cap refill <2s  Neurological:     Mental Status: She is alert and oriented to person, place, and time.    UC Treatments / Results  Labs (all labs ordered are listed, but only abnormal results are displayed) Labs Reviewed - No data to display  EKG None  Radiology Dg Elbow Complete Left   Result Date: 09/15/2018 CLINICAL DATA:  Injury and pain. EXAM: LEFT ELBOW - COMPLETE 3+ VIEW COMPARISON:  None. FINDINGS: There is no evidence of fracture, dislocation, or joint effusion. There is no evidence of arthropathy or other focal bone abnormality. Soft tissues are unremarkable. IMPRESSION: Negative. Electronically Signed   By: Katherine Mantlehristopher  Green M.D.   On: 09/15/2018 17:25    Procedures Procedures (including critical care time)  Medications Ordered in UC Medications - No data to display  Initial Impression / Assessment and Plan / UC Course  I have reviewed the triage vital signs and the nursing notes.  Pertinent labs & imaging results that were available during my care of the patient were reviewed by me and considered in my medical decision making (see chart for details).    Discussed low suspicion for fractures, however, patient requesting xray. Given patient cannot fully explain injury, will obtain xray.  Xray negative for fracture or dislocation. Prednisone as directed. Ice compress. Patient to follow up with PCP/orthopedics for further evaluation if needed.   Final Clinical Impressions(s) / UC Diagnoses   Final diagnoses:  Left elbow pain    ED Prescriptions    Medication Sig Dispense Auth. Provider   predniSONE (DELTASONE) 50 MG tablet Take 1 tablet (50 mg total) by mouth daily with breakfast. 5 tablet Threasa AlphaYu, Vickee Mormino V, PA-C        Amilee Janvier V, [place]PA-C 09/15/18 1756

## 2019-09-20 ENCOUNTER — Other Ambulatory Visit: Payer: Self-pay

## 2019-09-20 ENCOUNTER — Encounter: Payer: Self-pay | Admitting: Physical Therapy

## 2019-09-20 ENCOUNTER — Ambulatory Visit: Payer: 59 | Attending: Adult Health Nurse Practitioner | Admitting: Physical Therapy

## 2019-09-20 DIAGNOSIS — M542 Cervicalgia: Secondary | ICD-10-CM | POA: Insufficient documentation

## 2019-09-20 DIAGNOSIS — M6281 Muscle weakness (generalized): Secondary | ICD-10-CM | POA: Diagnosis present

## 2019-09-20 DIAGNOSIS — R252 Cramp and spasm: Secondary | ICD-10-CM | POA: Insufficient documentation

## 2019-09-20 NOTE — Patient Instructions (Signed)
Access Code: [email or web address] URL: [email or web address] Date: 09/20/2019 Prepared by: Lysle Rubens  Exercises Seated Cervical Retraction - 1 x daily - 7 x weekly - 3 sets - 10 reps - 3 sec hold Standing Lower Cervical and Upper Thoracic Stretch - 1 x daily - 7 x weekly - 3 sets - 2 reps - 15-20 sec hold Seated Upper Trapezius Stretch - 1 x daily - 7 x weekly - 3 sets - 2 reps - 30 sec hold Seated Levator Scapulae Stretch - 1 x daily - 7 x weekly - 3 sets - 2 reps - 30 sec hold Seated Scapular Retraction - 1 x daily - 7 x weekly - 3 sets - 10 reps - 3 sec hold
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
## 2019-10-04 ENCOUNTER — Encounter: Payer: Self-pay | Admitting: Physical Therapy

## 2019-10-04 ENCOUNTER — Other Ambulatory Visit: Payer: Self-pay

## 2019-10-04 ENCOUNTER — Ambulatory Visit: Payer: 59 | Attending: Adult Health Nurse Practitioner | Admitting: Physical Therapy

## 2019-10-04 DIAGNOSIS — R252 Cramp and spasm: Secondary | ICD-10-CM | POA: Diagnosis present

## 2019-10-04 DIAGNOSIS — M542 Cervicalgia: Secondary | ICD-10-CM | POA: Diagnosis present

## 2019-10-04 DIAGNOSIS — M6281 Muscle weakness (generalized): Secondary | ICD-10-CM | POA: Insufficient documentation

## 2019-10-04 NOTE — Patient Instructions (Signed)

## 2019-10-04 NOTE — Therapy (Signed)
[facility] [place] [address] [place], [place], [address] Phone: [phone]   Fax:  [phone]  Physical Therapy Treatment  Patient Details  Name: Lori Stout MRN: [id number] Date of Birth: 1960/11/11 Referring Provider (PT): Sharon Seller   Encounter Date: 10/04/2019   PT End of Session - 10/04/19 1654    Visit Number 2    Date for PT Re-Evaluation 11/20/19    PT Start Time 1618    PT Stop Time 1700    PT Time Calculation (min) 42 min    Activity Tolerance Patient tolerated treatment well    Behavior During Therapy [facility] for tasks assessed/performed           Past Medical History:  Diagnosis Date  . Allergy   . Arthritis   . Kidney stone   . Urinary tract infection     Past Surgical History:  Procedure Laterality Date  . CESAREAN SECTION    . COSMETIC SURGERY  2006   breast implants  . EYE SURGERY      There were no vitals filed for this visit.   Subjective Assessment - 10/04/19 1622    Subjective Pt states that her neck pain is the same; reports exercises are going okay    Currently in Pain? Yes    Pain Score 7    when turning neck; states 0 at neutral   Pain Location Neck    Pain Orientation Left;Mid                             [facility] Adult PT Treatment/Exercise - 10/04/19 0001      Neck Exercises: Machines for Strengthening   UBE (Upper Arm Bike) L3 3 min fwd/bkwd    Cybex Row 20# 2x15    Cybex Chest Press 10# 2x15    Lat Pull 20# 2x15    Other Machines for Strengthening 10# 2x15 shoulder ext      Neck Exercises: Theraband   Scapula Retraction 15 reps;Red    Shoulder External Rotation 15 reps;Red    Shoulder Internal Rotation 15 reps;Red      Manual Therapy   Manual Therapy Passive ROM;Soft tissue mobilization    Soft tissue mobilization STM to B UT focus on L>R    Passive ROM PROM to UT/levator                    PT Short Term Goals - 09/20/19 1804       PT SHORT TERM GOAL #1   Title Pt will be independent with HEP    Time 2    Period Weeks    Status New    Target Date 10/04/19             PT Long Term Goals - 09/20/19 1804      PT LONG TERM GOAL #1   Title Pt will demonstrate cervical extension/lateral flexion WFL with no complaints of increased cervical pain    Time 6    Period Weeks    Status New    Target Date 11/01/19      PT LONG TERM GOAL #2   Title Pt will report resolution of radiating pain into L UE    Time 6    Period Weeks    Status New    Target Date 11/01/19      PT LONG TERM GOAL #3   Title Pt  will demonstrate L grip strength equivalent to R    Time 6    Period Weeks    Status New    Target Date 11/01/19      PT LONG TERM GOAL #4   Title Pt will report reduction in pain by 50%    Time 6    Period Weeks    Status New    Target Date 11/01/19      PT LONG TERM GOAL #5   Title Pt will report able to work in garden for >1 hour with no increase in neck pain    Time 6    Period Weeks    Status New    Target Date 11/01/19                 Plan - 10/04/19 1655    Clinical Impression Statement Pt tolerated progression to TE well; complained of increased L neck pain with chest press and UBE. Pt reported relief with STM. Required cues during postural ex's for form.    PT Treatment/Interventions ADLs/Self Care Home Management;Electrical Stimulation;Traction;Moist Heat;Iontophoresis 4mg /ml Dexamethasone;Therapeutic activities;Therapeutic exercise;Neuromuscular re-education;Manual techniques;Patient/family education;Passive range of motion;Dry needling;Taping    PT Next Visit Plan review HEP, cervical ROM/flexibility/strengthening    Consulted and Agree with Plan of Care Patient           Patient will benefit from skilled therapeutic intervention in order to improve the following deficits and impairments:  Decreased range of motion, Increased muscle spasms, Impaired UE functional use, Pain,  Impaired flexibility  Visit Diagnosis: Neck pain  Muscle weakness (generalized)  Cramp and spasm     Problem List Patient Active Problem List   Diagnosis Date Noted  . Spinal stenosis of lumbar region 01/09/2013  . Flank pain 01/07/2013  . Hematuria 01/07/2013   03/09/2013, PT, DPT Lysle Rubens Jerald Villalona 10/04/2019, 4:56 PM  [facility] [place] [address] [place], [place], [place] Phone: [phone]   Fax:  [phone]  Name: SHAQUITTA BURBRIDGE MRN: Everlean Alstrom Date of Birth: [birth year]

## 2019-10-06 ENCOUNTER — Encounter: Payer: Self-pay | Admitting: Physical Therapy

## 2019-10-06 ENCOUNTER — Ambulatory Visit: Payer: 59 | Admitting: Physical Therapy

## 2019-10-06 ENCOUNTER — Other Ambulatory Visit: Payer: Self-pay

## 2019-10-06 DIAGNOSIS — M542 Cervicalgia: Secondary | ICD-10-CM | POA: Diagnosis not present

## 2019-10-06 DIAGNOSIS — M6281 Muscle weakness (generalized): Secondary | ICD-10-CM

## 2019-10-06 DIAGNOSIS — R252 Cramp and spasm: Secondary | ICD-10-CM

## 2019-10-06 NOTE — Therapy (Signed)
[facility] [place] [address] [place], [place], [address] Phone: [phone]   Fax:  [phone]  Physical Therapy Treatment  Patient Details  Name: Lori Stout MRN: [id number] Date of Birth: February 02, 1961 Referring Provider (PT): Sharon Seller   Encounter Date: 10/06/2019   PT End of Session - 10/06/19 1647    Visit Number 3    Date for PT Re-Evaluation 11/20/19    PT Start Time 1615    PT Stop Time 1658    PT Time Calculation (min) 43 min    Activity Tolerance Patient tolerated treatment well    Behavior During Therapy [facility] for tasks assessed/performed           Past Medical History:  Diagnosis Date  . Allergy   . Arthritis   . Kidney stone   . Urinary tract infection     Past Surgical History:  Procedure Laterality Date  . CESAREAN SECTION    . COSMETIC SURGERY  2006   breast implants  . EYE SURGERY      There were no vitals filed for this visit.   Subjective Assessment - 10/06/19 1614    Subjective Pt states that DN helped a lot    Currently in Pain? Yes    Pain Score 2     Pain Location Neck    Pain Orientation Left;Mid                             [facility] Adult PT Treatment/Exercise - 10/06/19 0001      Neck Exercises: Machines for Strengthening   Nustep L5 x 6 min    Cybex Row 20#, 25# 2x15    Cybex Chest Press 10# 2x15    Lat Pull 25# 2x15    Other Machines for Strengthening 10# 2x15 shoulder ext      Neck Exercises: Standing   Other Standing Exercises standing shoulder abd/flex 4# 1x10    Other Standing Exercises shoulder IR/ER 5# 1x10      Manual Therapy   Manual Therapy Passive ROM;Soft tissue mobilization    Soft tissue mobilization STM to B UT focus on L>R    Passive ROM PROM to UT/levator      Neck Exercises: Stretches   Upper Trapezius Stretch Right;Left;1 rep;30 seconds    Levator Stretch Left;Right;1 rep;30 seconds                    PT  Short Term Goals - 09/20/19 1804      PT SHORT TERM GOAL #1   Title Pt will be independent with HEP    Time 2    Period Weeks    Status New    Target Date 10/04/19             PT Long Term Goals - 09/20/19 1804      PT LONG TERM GOAL #1   Title Pt will demonstrate cervical extension/lateral flexion WFL with no complaints of increased cervical pain    Time 6    Period Weeks    Status New    Target Date 11/01/19      PT LONG TERM GOAL #2   Title Pt will report resolution of radiating pain into L UE    Time 6    Period Weeks    Status New    Target Date 11/01/19      PT LONG TERM GOAL #  3   Title Pt will demonstrate L grip strength equivalent to R    Time 6    Period Weeks    Status New    Target Date 11/01/19      PT LONG TERM GOAL #4   Title Pt will report reduction in pain by 50%    Time 6    Period Weeks    Status New    Target Date 11/01/19      PT LONG TERM GOAL #5   Title Pt will report able to work in garden for >1 hour with no increase in neck pain    Time 6    Period Weeks    Status New    Target Date 11/01/19                 Plan - 10/06/19 1723    Clinical Impression Statement Pt tolerated progression of TE well; no increase in L neck pain with increased resistance ex's. Pt reports relief with DN and STM. Job specific ex's next rx.    PT Treatment/Interventions ADLs/Self Care Home Management;Electrical Stimulation;Traction;Moist Heat;Iontophoresis 4mg /ml Dexamethasone;Therapeutic activities;Therapeutic exercise;Neuromuscular re-education;Manual techniques;Patient/family education;Passive range of motion;Dry needling;Taping    PT Next Visit Plan cervical ROM/flexibility/strengthening, UE strengthening/flexibility    Consulted and Agree with Plan of Care Patient           Patient will benefit from skilled therapeutic intervention in order to improve the following deficits and impairments:  Decreased range of motion, Increased muscle spasms,  Impaired UE functional use, Pain, Impaired flexibility  Visit Diagnosis: Neck pain  Muscle weakness (generalized)  Cramp and spasm     Problem List Patient Active Problem List   Diagnosis Date Noted  . Spinal stenosis of lumbar region 01/09/2013  . Flank pain 01/07/2013  . Hematuria 01/07/2013   03/09/2013, PT, DPT Lysle Rubens [address] 10/06/2019, 5:28 PM  [facility] [place] [address] [place], [place], [place] Phone: [phone]   Fax:  [phone]  Name: Lori Stout MRN: Everlean Alstrom Date of Birth: 01/24/1961

## 2019-10-11 ENCOUNTER — Ambulatory Visit: Payer: 59 | Admitting: Physical Therapy

## 2019-10-11 ENCOUNTER — Encounter: Payer: Self-pay | Admitting: Physical Therapy

## 2019-10-11 ENCOUNTER — Other Ambulatory Visit: Payer: Self-pay

## 2019-10-11 DIAGNOSIS — M6281 Muscle weakness (generalized): Secondary | ICD-10-CM

## 2019-10-11 DIAGNOSIS — M542 Cervicalgia: Secondary | ICD-10-CM | POA: Diagnosis not present

## 2019-10-11 DIAGNOSIS — R252 Cramp and spasm: Secondary | ICD-10-CM

## 2019-10-11 NOTE — Therapy (Signed)
[facility] [address] [place], [place], [address] Phone: [phone]   Fax:  [phone]  Physical Therapy Treatment  Patient Details  Name: Lori Stout MRN: [id number] Date of Birth: 07/01/1960 Referring Provider (PT): Sharon Seller   Encounter Date: 10/11/2019   PT End of Session - 10/11/19 1525    Visit Number 4    Date for PT Re-Evaluation 11/20/19    PT Start Time 1444    PT Stop Time 1535    PT Time Calculation (min) 51 min    Activity Tolerance Patient tolerated treatment well    Behavior During Therapy [facility] for tasks assessed/performed           Past Medical History:  Diagnosis Date  . Allergy   . Arthritis   . Kidney stone   . Urinary tract infection     Past Surgical History:  Procedure Laterality Date  . CESAREAN SECTION    . COSMETIC SURGERY  2006   breast implants  . EYE SURGERY      There were no vitals filed for this visit.   Subjective Assessment - 10/11/19 1452    Subjective Pt states that neck is feeling good today    Currently in Pain? Yes    Pain Score 2     Pain Location Neck    Pain Orientation Left                             [facility] Adult PT Treatment/Exercise - 10/11/19 0001      Neck Exercises: Machines for Strengthening   UBE (Upper Arm Bike) L3 3 min fwd/bkwd    Cybex Row 25# 2x15    Lat Pull 25# 2x15    Power Tower IR/ER 5# 1x10    Other Machines for Strengthening 10# 2x15 shoulder ext    Other Machines for Strengthening 20# tricep extension 2x15, 10# bicep curls 2x15      Neck Exercises: Standing   Other Standing Exercises standing shoulder abd/flex 4# 1x10      Modalities   Modalities Moist Heat      Moist Heat Therapy   Number Minutes Moist Heat 10 Minutes    Moist Heat Location Cervical      Manual Therapy   Manual Therapy Passive ROM;Soft tissue mobilization    Soft tissue mobilization STM to B UT focus on L>R    Passive  ROM PROM to UT/levator                    PT Short Term Goals - 09/20/19 1804      PT SHORT TERM GOAL #1   Title Pt will be independent with HEP    Time 2    Period Weeks    Status New    Target Date 10/04/19             PT Long Term Goals - 09/20/19 1804      PT LONG TERM GOAL #1   Title Pt will demonstrate cervical extension/lateral flexion WFL with no complaints of increased cervical pain    Time 6    Period Weeks    Status New    Target Date 11/01/19      PT LONG TERM GOAL #2   Title Pt will report resolution of radiating pain into L UE    Time 6    Period Weeks  Status New    Target Date 11/01/19      PT LONG TERM GOAL #3   Title Pt will demonstrate L grip strength equivalent to R    Time 6    Period Weeks    Status New    Target Date 11/01/19      PT LONG TERM GOAL #4   Title Pt will report reduction in pain by 50%    Time 6    Period Weeks    Status New    Target Date 11/01/19      PT LONG TERM GOAL #5   Title Pt will report able to work in garden for >1 hour with no increase in neck pain    Time 6    Period Weeks    Status New    Target Date 11/01/19                 Plan - 10/11/19 1525    Clinical Impression Statement Pt tolerated progression of TE well; mild increase in L neck pain noted with lat pulls and standing shoulder extensions. Begin incorporating more functional ex's. Pt responded well to heat and STM.    PT Treatment/Interventions ADLs/Self Care Home Management;Electrical Stimulation;Traction;Moist Heat;Iontophoresis 4mg /ml Dexamethasone;Therapeutic activities;Therapeutic exercise;Neuromuscular re-education;Manual techniques;Patient/family education;Passive range of motion;Dry needling;Taping    PT Next Visit Plan cervical ROM/flexibility/strengthening, UE strengthening/flexibility    Consulted and Agree with Plan of Care Patient           Patient will benefit from skilled therapeutic intervention in order to  improve the following deficits and impairments:  Decreased range of motion, Increased muscle spasms, Impaired UE functional use, Pain, Impaired flexibility  Visit Diagnosis: Neck pain  Muscle weakness (generalized)  Cramp and spasm     Problem List Patient Active Problem List   Diagnosis Date Noted  . Spinal stenosis of lumbar region 01/09/2013  . Flank pain 01/07/2013  . Hematuria 01/07/2013   03/09/2013, PT, DPT Lysle Rubens Neosha Switalski 10/11/2019, 3:27 PM  [facility] [address] [place], [place], [place] Phone: [phone]   Fax:  [phone]  Name: HEIDIE KRALL MRN: Everlean Alstrom Date of Birth: [birth year]

## 2019-10-13 ENCOUNTER — Other Ambulatory Visit: Payer: Self-pay

## 2019-10-13 ENCOUNTER — Encounter: Payer: Self-pay | Admitting: Physical Therapy

## 2019-10-13 ENCOUNTER — Ambulatory Visit: Payer: 59 | Admitting: Physical Therapy

## 2019-10-13 DIAGNOSIS — M542 Cervicalgia: Secondary | ICD-10-CM

## 2019-10-13 DIAGNOSIS — R252 Cramp and spasm: Secondary | ICD-10-CM

## 2019-10-13 DIAGNOSIS — M6281 Muscle weakness (generalized): Secondary | ICD-10-CM

## 2019-10-13 NOTE — Therapy (Signed)
[place], [place], [address] Phone: [phone]   Fax:  [phone]  Physical Therapy Treatment  Patient Details  Name: Lori Stout MRN: [id number] Date of Birth: [birth year] Referring Provider (PT): Lars Mage   Encounter Date: 10/13/2019   PT End of Session - 10/13/19 6387    Visit Number 5    Date for PT Re-Evaluation 11/20/19    PT Start Time 1400    PT Stop Time 1453    PT Time Calculation (min) 53 min    Activity Tolerance Patient tolerated treatment well    Behavior During Therapy [facility] for tasks assessed/performed           Past Medical History:  Diagnosis Date  . Allergy   . Arthritis   . Kidney stone   . Urinary tract infection     Past Surgical History:  Procedure Laterality Date  . CESAREAN SECTION    . COSMETIC SURGERY  2006   breast implants  . EYE SURGERY      There were no vitals filed for this visit.   Subjective Assessment - 10/13/19 1405    Subjective Pt states that neck is feeling good today    Currently in Pain? Yes    Pain Score 3     Pain Location Neck    Pain Orientation Left              [facility] PT Assessment - 10/13/19 0001      Strength   Right Hand Grip (lbs) 65    Left Hand Grip (lbs) 60                         [facility] Adult PT Treatment/Exercise - 10/13/19 0001      Neck Exercises: Machines for Strengthening   UBE (Upper Arm Bike) L3 4 min fwd/3 min bkwd    Cybex Row 25# 2x15    Lat Pull 25# 2x15      Modalities   Modalities Electrical Stimulation      Moist Heat Therapy   Number Minutes Moist Heat 12 Minutes    Moist Heat Location Cervical      Electrical Stimulation   Electrical Stimulation Location Cervical    Electrical Stimulation Action IFC    Electrical Stimulation Parameters supine    Electrical Stimulation Goals Pain      Manual Therapy   Manual Therapy Passive ROM;Soft tissue mobilization     Soft tissue mobilization STM to B UT focus on L>R    Passive ROM PROM to UT/levator                    PT Short Term Goals - 09/20/19 1804      PT SHORT TERM GOAL #1   Title Pt will be independent with HEP    Time 2    Period Weeks    Status New    Target Date 10/04/19             PT Long Term Goals - 10/13/19 1412      PT LONG TERM GOAL #1   Title Pt will demonstrate cervical extension/lateral flexion WFL with no complaints of increased cervical pain    Time 6    Period Weeks    Status On-going      PT LONG TERM GOAL #2   Title Pt will report resolution of radiating pain into  L UE    Time 6    Period Weeks    Status Achieved      PT LONG TERM GOAL #3   Title Pt will demonstrate L grip strength equivalent to R    Time 6    Period Weeks    Status Partially Met      PT LONG TERM GOAL #4   Title Pt will report reduction in pain by 50%    Time 6    Period Weeks    Status On-going      PT LONG TERM GOAL #5   Title Pt will report able to work in garden for >1 hour with no increase in neck pain    Time 6    Period Weeks    Status Achieved                 Plan - 10/13/19 1438    Clinical Impression Statement Pt tolerated progression of TE well. Pt is making progress towards goals; tenderness in L cervical spine, ROM, and functional abilities have improved. Pt still reports difficulty with turning neck and cervical rotation is limited B L>R. Continue to progress towards goals.    PT Treatment/Interventions ADLs/Self Care Home Management;Electrical Stimulation;Traction;Moist Heat;Iontophoresis '4mg'$ /ml Dexamethasone;Therapeutic activities;Therapeutic exercise;Neuromuscular re-education;Manual techniques;Patient/family education;Passive range of motion;Dry needling;Taping    PT Next Visit Plan cervical ROM/flexibility/strengthening, UE strengthening/flexibility    Consulted and Agree with Plan of Care Patient           Patient will benefit from  skilled therapeutic intervention in order to improve the following deficits and impairments:  Decreased range of motion, Increased muscle spasms, Impaired UE functional use, Pain, Impaired flexibility  Visit Diagnosis: Neck pain  Muscle weakness (generalized)  Cramp and spasm     Problem List Patient Active Problem List   Diagnosis Date Noted  . Spinal stenosis of lumbar region 01/09/2013  . Flank pain 01/07/2013  . Hematuria 01/07/2013   Amador Cunas, PT, DPT Donald Prose Juliano Mceachin 10/13/2019, 2:42 PM  [place] [address] [place], [place], [address] Phone: [phone]   Fax:  [phone]  Name: Lori Stout MRN: [id number] Date of Birth: 1960-05-21

## 2019-10-18 ENCOUNTER — Ambulatory Visit: Payer: 59 | Admitting: Physical Therapy

## 2019-10-18 ENCOUNTER — Other Ambulatory Visit: Payer: Self-pay

## 2019-10-18 ENCOUNTER — Encounter: Payer: Self-pay | Admitting: Physical Therapy

## 2019-10-18 DIAGNOSIS — M542 Cervicalgia: Secondary | ICD-10-CM | POA: Diagnosis not present

## 2019-10-18 DIAGNOSIS — M6281 Muscle weakness (generalized): Secondary | ICD-10-CM

## 2019-10-18 DIAGNOSIS — R252 Cramp and spasm: Secondary | ICD-10-CM

## 2019-10-18 NOTE — Therapy (Signed)
[place], [place], [address] Phone: [phone]   Fax:  [phone]  Physical Therapy Treatment  Patient Details  Name: Lori Stout MRN: [id number] Date of Birth: [birth year] Referring Provider (PT): Lars Mage   Encounter Date: 10/18/2019   PT End of Session - 10/18/19 1648    Visit Number 6    Date for PT Re-Evaluation 11/20/19    PT Start Time 1611    PT Stop Time 1645    PT Time Calculation (min) 34 min    Activity Tolerance Patient tolerated treatment well    Behavior During Therapy [facility] for tasks assessed/performed           Past Medical History:  Diagnosis Date  . Allergy   . Arthritis   . Kidney stone   . Urinary tract infection     Past Surgical History:  Procedure Laterality Date  . CESAREAN SECTION    . COSMETIC SURGERY  2006   breast implants  . EYE SURGERY      There were no vitals filed for this visit.   Subjective Assessment - 10/18/19 1617    Subjective Doing fine, neck is still irritating when she turns but it is getting better    Currently in Pain? No/denies    Pain Location Neck    Pain Descriptors / Indicators Nagging                             [facility] Adult PT Treatment/Exercise - 10/18/19 0001      Neck Exercises: Machines for Strengthening   UBE (Upper Arm Bike) L4 47mn fwd/3 min bkwd    Cybex Row 25# 2x15    Lat Pull 25# 2x15    Other Machines for Strengthening 10# 2x10 shoulder ext      Neck Exercises: Standing   Other Standing Exercises ER red 2x10       Neck Exercises: Seated   Neck Retraction 20 reps;3 secs      Manual Therapy   Manual Therapy Passive ROM;Soft tissue mobilization;Manual Traction    Soft tissue mobilization STM to B UT focus on L>R    Passive ROM PROM to UT/levator    Manual Traction cervical spine 5x 15 sec                    PT Short Term Goals - 09/20/19 1804      PT SHORT  TERM GOAL #1   Title Pt will be independent with HEP    Time 2    Period Weeks    Status New    Target Date 10/04/19             PT Long Term Goals - 10/13/19 1412      PT LONG TERM GOAL #1   Title Pt will demonstrate cervical extension/lateral flexion WFL with no complaints of increased cervical pain    Time 6    Period Weeks    Status On-going      PT LONG TERM GOAL #2   Title Pt will report resolution of radiating pain into L UE    Time 6    Period Weeks    Status Achieved      PT LONG TERM GOAL #3   Title Pt will demonstrate L grip strength equivalent to R    Time 6    Period Weeks  Status Partially Met      PT LONG TERM GOAL #4   Title Pt will report reduction in pain by 50%    Time 6    Period Weeks    Status On-going      PT LONG TERM GOAL #5   Title Pt will report able to work in garden for >1 hour with no increase in neck pain    Time 6    Period Weeks    Status Achieved                 Plan - 10/18/19 1648    Clinical Impression Statement Pt 11 minutes late today. Tactile cues to elbows to keep arms to side with external rotation. No reports of pain cues deeded to drive elbows to side with pull downs. Some tightness noted with cervical side bending. One point of tenderness on the cervical spine.    Personal Factors and Comorbidities Comorbidity 1;Past/Current Experience    Comorbidities arthritis    Examination-Activity Limitations Lift;Reach Overhead;Carry    Examination-Participation Restrictions Community Activity;Interpersonal Relationship;Yard Work    PT Duration 6 weeks    PT Treatment/Interventions ADLs/Self Care Home Management;Electrical Stimulation;Traction;Moist Heat;Iontophoresis '4mg'$ /ml Dexamethasone;Therapeutic activities;Therapeutic exercise;Neuromuscular re-education;Manual techniques;Patient/family education;Passive range of motion;Dry needling;Taping    PT Next Visit Plan cervical ROM/flexibility/strengthening, UE  strengthening/flexibility           Patient will benefit from skilled therapeutic intervention in order to improve the following deficits and impairments:  Decreased range of motion, Increased muscle spasms, Impaired UE functional use, Pain, Impaired flexibility  Visit Diagnosis: Cramp and spasm  Muscle weakness (generalized)  Neck pain     Problem List Patient Active Problem List   Diagnosis Date Noted  . Spinal stenosis of lumbar region 01/09/2013  . Flank pain 01/07/2013  . Hematuria 01/07/2013    Scot Jun 10/18/2019, 4:51 PM  [place] [address] [place], [place], [address] Phone: [phone]   Fax:  [phone]  Name: Lori Stout MRN: [id number] Date of Birth: [birth year]

## 2019-10-20 ENCOUNTER — Encounter: Payer: Self-pay | Admitting: Physical Therapy

## 2019-10-20 ENCOUNTER — Ambulatory Visit: Payer: 59 | Admitting: Physical Therapy

## 2019-10-20 ENCOUNTER — Other Ambulatory Visit: Payer: Self-pay

## 2019-10-20 DIAGNOSIS — R252 Cramp and spasm: Secondary | ICD-10-CM

## 2019-10-20 DIAGNOSIS — M542 Cervicalgia: Secondary | ICD-10-CM | POA: Diagnosis not present

## 2019-10-20 DIAGNOSIS — M6281 Muscle weakness (generalized): Secondary | ICD-10-CM

## 2019-10-20 NOTE — Therapy (Signed)
[place], [place], [address] Phone: [phone]   Fax:  [phone]  Physical Therapy Treatment  Patient Details  Name: Lori Stout MRN: [id number] Date of Birth: 01-18-1961 Referring Provider (PT): Lars Mage   Encounter Date: 10/20/2019   PT End of Session - 10/20/19 1529    Visit Number 7    Date for PT Re-Evaluation 11/20/19    PT Start Time 1446    PT Stop Time 1529    PT Time Calculation (min) 43 min    Activity Tolerance Patient tolerated treatment well    Behavior During Therapy [facility] for tasks assessed/performed           Past Medical History:  Diagnosis Date  . Allergy   . Arthritis   . Kidney stone   . Urinary tract infection     Past Surgical History:  Procedure Laterality Date  . CESAREAN SECTION    . COSMETIC SURGERY  2006   breast implants  . EYE SURGERY      There were no vitals filed for this visit.   Subjective Assessment - 10/20/19 1456    Subjective Pt reports she is doing much better; states STM helped a lot last rx    Currently in Pain? No/denies                             [facility] Adult PT Treatment/Exercise - 10/20/19 0001      Neck Exercises: Machines for Strengthening   UBE (Upper Arm Bike) constant work 20 watts x 6 min    Cybex Row 25# 2x15    Lat Pull 25# 2x15    Other Machines for Strengthening 10# 2x15 shoulder ext    Other Machines for Strengthening 25# tricep extension 2x15, 10# bicep curls 2x15      Neck Exercises: Standing   Other Standing Exercises ER green 2x0      Manual Therapy   Manual Therapy Passive ROM;Soft tissue mobilization;Manual Traction    Soft tissue mobilization STM to B UT focus on L>R    Passive ROM PROM to UT/levator    Manual Traction cervical spine 5x 15 sec                    PT Short Term Goals - 09/20/19 1804      PT SHORT TERM GOAL #1   Title Pt will be independent  with HEP    Time 2    Period Weeks    Status New    Target Date 10/04/19             PT Long Term Goals - 10/13/19 1412      PT LONG TERM GOAL #1   Title Pt will demonstrate cervical extension/lateral flexion WFL with no complaints of increased cervical pain    Time 6    Period Weeks    Status On-going      PT LONG TERM GOAL #2   Title Pt will report resolution of radiating pain into L UE    Time 6    Period Weeks    Status Achieved      PT LONG TERM GOAL #3   Title Pt will demonstrate L grip strength equivalent to R    Time 6    Period Weeks    Status Partially Met      PT LONG TERM  GOAL #4   Title Pt will report reduction in pain by 50%    Time 6    Period Weeks    Status On-going      PT LONG TERM GOAL #5   Title Pt will report able to work in garden for >1 hour with no increase in neck pain    Time 6    Period Weeks    Status Achieved                 Plan - 10/20/19 1530    Clinical Impression Statement Pt doing well with progression of TE; no complaints of pain with exercise today. Pt needed cues to avoid excessive shoulder elevation during exercise. Tightness/tenderness in L UT and cervical spine; pt reports relief with STM.    PT Treatment/Interventions ADLs/Self Care Home Management;Electrical Stimulation;Traction;Moist Heat;Iontophoresis '4mg'$ /ml Dexamethasone;Therapeutic activities;Therapeutic exercise;Neuromuscular re-education;Manual techniques;Patient/family education;Passive range of motion;Dry needling;Taping    PT Next Visit Plan cervical ROM/flexibility/strengthening, UE strengthening/flexibility    Consulted and Agree with Plan of Care Patient           Patient will benefit from skilled therapeutic intervention in order to improve the following deficits and impairments:  Decreased range of motion, Increased muscle spasms, Impaired UE functional use, Pain, Impaired flexibility  Visit Diagnosis: Cramp and spasm  Muscle weakness  (generalized)  Neck pain     Problem List Patient Active Problem List   Diagnosis Date Noted  . Spinal stenosis of lumbar region 01/09/2013  . Flank pain 01/07/2013  . Hematuria 01/07/2013   Amador Cunas, PT, DPT Donald Prose Manroop Jakubowicz 10/20/2019, 3:32 PM  [place] [address] [place], [place], [address] Phone: [phone]   Fax:  [phone]  Name: Lori Stout MRN: [id number] Date of Birth: Feb 24, 1961

## 2019-10-25 ENCOUNTER — Encounter: Payer: Self-pay | Admitting: Physical Therapy

## 2019-10-25 ENCOUNTER — Other Ambulatory Visit: Payer: Self-pay

## 2019-10-25 ENCOUNTER — Ambulatory Visit: Payer: 59 | Admitting: Physical Therapy

## 2019-10-25 DIAGNOSIS — M542 Cervicalgia: Secondary | ICD-10-CM | POA: Diagnosis not present

## 2019-10-25 DIAGNOSIS — M6281 Muscle weakness (generalized): Secondary | ICD-10-CM

## 2019-10-25 DIAGNOSIS — R252 Cramp and spasm: Secondary | ICD-10-CM

## 2019-10-25 NOTE — Therapy (Signed)
[place], [place], [address] Phone: [phone]   Fax:  [phone]  Physical Therapy Treatment  Patient Details  Name: Lori Stout MRN: [id number] Date of Birth: 1960/06/16 Referring Provider (PT): Lars Mage   Encounter Date: 10/25/2019   PT End of Session - 10/25/19 1610    Visit Number 8    Date for PT Re-Evaluation 11/20/19    PT Start Time 3546    PT Stop Time 1619    PT Time Calculation (min) 45 min    Activity Tolerance Patient tolerated treatment well    Behavior During Therapy [facility] for tasks assessed/performed           Past Medical History:  Diagnosis Date  . Allergy   . Arthritis   . Kidney stone   . Urinary tract infection     Past Surgical History:  Procedure Laterality Date  . CESAREAN SECTION    . COSMETIC SURGERY  2006   breast implants  . EYE SURGERY      There were no vitals filed for this visit.   Subjective Assessment - 10/25/19 1537    Subjective Pt reports she is feeling better overall but neck is stiff after lifting a lot this past weekend    Currently in Pain? Yes    Pain Score 3     Pain Location Neck                             [facility] Adult PT Treatment/Exercise - 10/25/19 0001      Neck Exercises: Machines for Strengthening   UBE (Upper Arm Bike) constant work 25 watts x 6 min    Cybex Row 25# 2x15    Cybex Chest Press 10# 2x15    Lat Pull 25# 2x15      Neck Exercises: Theraband   Other Theraband Exercises shoulder 3 ways with red TB on wall      Neck Exercises: Standing   Other Standing Exercises standing shoulder abd/flex 4# 2x10      Moist Heat Therapy   Number Minutes Moist Heat 10 Minutes    Moist Heat Location Cervical      Manual Therapy   Manual Therapy Passive ROM;Soft tissue mobilization;Manual Traction    Soft tissue mobilization STM to B UT focus on L>R    Passive ROM PROM to UT/levator                     PT Short Term Goals - 09/20/19 1804      PT SHORT TERM GOAL #1   Title Pt will be independent with HEP    Time 2    Period Weeks    Status New    Target Date 10/04/19             PT Long Term Goals - 10/13/19 1412      PT LONG TERM GOAL #1   Title Pt will demonstrate cervical extension/lateral flexion WFL with no complaints of increased cervical pain    Time 6    Period Weeks    Status On-going      PT LONG TERM GOAL #2   Title Pt will report resolution of radiating pain into L UE    Time 6    Period Weeks    Status Achieved      PT LONG TERM GOAL #3  Title Pt will demonstrate L grip strength equivalent to R    Time 6    Period Weeks    Status Partially Met      PT LONG TERM GOAL #4   Title Pt will report reduction in pain by 50%    Time 6    Period Weeks    Status On-going      PT LONG TERM GOAL #5   Title Pt will report able to work in garden for >1 hour with no increase in neck pain    Time 6    Period Weeks    Status Achieved                 Plan - 10/25/19 1610    Clinical Impression Statement Pt doing well with progression of TE; complained of mild neck pain with chest press. Pt continues to be point tender in L UT and cervical spine; reports relief with STM to UT and suboccipitals.    PT Treatment/Interventions ADLs/Self Care Home Management;Electrical Stimulation;Traction;Moist Heat;Iontophoresis '4mg'$ /ml Dexamethasone;Therapeutic activities;Therapeutic exercise;Neuromuscular re-education;Manual techniques;Patient/family education;Passive range of motion;Dry needling;Taping    PT Next Visit Plan cervical ROM/flexibility/strengthening, UE strengthening/flexibility    Consulted and Agree with Plan of Care Patient           Patient will benefit from skilled therapeutic intervention in order to improve the following deficits and impairments:  Decreased range of motion, Increased muscle spasms, Impaired UE functional use,  Pain, Impaired flexibility  Visit Diagnosis: Cramp and spasm  Muscle weakness (generalized)  Neck pain     Problem List Patient Active Problem List   Diagnosis Date Noted  . Spinal stenosis of lumbar region 01/09/2013  . Flank pain 01/07/2013  . Hematuria 01/07/2013   Amador Cunas, PT, DPT Donald Prose Bryson Gavia 10/25/2019, [place] [address] [place], [place], [address] Phone: [phone]   Fax:  [phone]  Name: Lori Stout MRN: [id number] Date of Birth: Aug 14, 1960

## 2019-10-27 ENCOUNTER — Ambulatory Visit: Payer: 59

## 2019-10-27 ENCOUNTER — Other Ambulatory Visit: Payer: Self-pay

## 2019-10-27 DIAGNOSIS — M6281 Muscle weakness (generalized): Secondary | ICD-10-CM

## 2019-10-27 DIAGNOSIS — R252 Cramp and spasm: Secondary | ICD-10-CM

## 2019-10-27 DIAGNOSIS — M542 Cervicalgia: Secondary | ICD-10-CM | POA: Diagnosis not present

## 2019-10-27 NOTE — Therapy (Signed)
[facility] [address] [place], [place], [address] Phone: [phone]   Fax:  [phone]  Physical Therapy Treatment  Patient Details  Name: Lori Stout MRN: [id number] Date of Birth: Jan 22, 1961 Referring Provider (PT): Sharon Seller   Encounter Date: 10/27/2019   PT End of Session - 10/27/19 1439    Visit Number 9    Date for PT Re-Evaluation 11/20/19    PT Start Time 1436    PT Stop Time 1526    PT Time Calculation (min) 50 min    Activity Tolerance Patient tolerated treatment well    Behavior During Therapy [facility] for tasks assessed/performed           Past Medical History:  Diagnosis Date  . Allergy   . Arthritis   . Kidney stone   . Urinary tract infection     Past Surgical History:  Procedure Laterality Date  . CESAREAN SECTION    . COSMETIC SURGERY  2006   breast implants  . EYE SURGERY      There were no vitals filed for this visit.   Subjective Assessment - 10/27/19 1436    Subjective Pt reports she has the same old stiffness.    Currently in Pain? Yes    Pain Score 2     Pain Location Neck    Pain Orientation Left    Pain Descriptors / Indicators Tightness                             [facility] Adult PT Treatment/Exercise - 10/27/19 0001      Neck Exercises: Machines for Strengthening   UBE (Upper Arm Bike) L4 3 min fwd/3 min bkwd    Cybex Row 25# x15, 35# x 15      Neck Exercises: Standing   Other Standing Exercises LAT PD BTB x 20    max VCs/TCs for scap retract/depress   Other Standing Exercises Standing abd 4# 1x10, ER at 90/90 at corner x 15, standing posture in doorway x30"   VCs/TCs for proper form and reduced shoulder depression     Neck Exercises: Seated   Neck Retraction Limitations performed with all exercise      Moist Heat Therapy   Number Minutes Moist Heat 10 Minutes    Moist Heat Location Cervical      Manual Therapy   Manual Therapy  Passive ROM;Soft tissue mobilization;Manual Traction    Soft tissue mobilization STM to B UT focus on L>R    Passive ROM PROM to UT/levator    Manual Traction cervical spine 5x 15 sec                    PT Short Term Goals - 09/20/19 1804      PT SHORT TERM GOAL #1   Title Pt will be independent with HEP    Time 2    Period Weeks    Status New    Target Date 10/04/19             PT Long Term Goals - 10/13/19 1412      PT LONG TERM GOAL #1   Title Pt will demonstrate cervical extension/lateral flexion WFL with no complaints of increased cervical pain    Time 6    Period Weeks    Status On-going      PT LONG TERM GOAL #2   Title Pt  will report resolution of radiating pain into L UE    Time 6    Period Weeks    Status Achieved      PT LONG TERM GOAL #3   Title Pt will demonstrate L grip strength equivalent to R    Time 6    Period Weeks    Status Partially Met      PT LONG TERM GOAL #4   Title Pt will report reduction in pain by 50%    Time 6    Period Weeks    Status On-going      PT LONG TERM GOAL #5   Title Pt will report able to work in garden for >1 hour with no increase in neck pain    Time 6    Period Weeks    Status Achieved                 Plan - 10/27/19 1441    Clinical Impression Statement Pt tolerated tx well today. Altered PD to standing LAT PD with blue theraband based on pt c/o neck irritation with seated LAT PD --> reported improved tolerance. Multiple TrP's noted in B UT and L LS, as well as increased tension in L SO and LS origin. Pt educated on a stretch, but that stretching it is a quick fix, and her continued forward head posturing will created a cycle of tension in her neck until corrected.    PT Treatment/Interventions ADLs/Self Care Home Management;Electrical Stimulation;Traction;Moist Heat;Iontophoresis '4mg'$ /ml Dexamethasone;Therapeutic activities;Therapeutic exercise;Neuromuscular re-education;Manual  techniques;Patient/family education;Passive range of motion;Dry needling;Taping    PT Next Visit Plan cervical ROM/flexibility/strengthening, UE strengthening/flexibility, focus on cervical/shoulder corrective posturing    Consulted and Agree with Plan of Care Patient           Patient will benefit from skilled therapeutic intervention in order to improve the following deficits and impairments:  Decreased range of motion, Increased muscle spasms, Impaired UE functional use, Pain, Impaired flexibility  Visit Diagnosis: Neck pain  Cramp and spasm  Muscle weakness (generalized)     Problem List Patient Active Problem List   Diagnosis Date Noted  . Spinal stenosis of lumbar region 01/09/2013  . Flank pain 01/07/2013  . Hematuria 01/07/2013    Lori Stout, PT, DPT 10/27/2019, 3:35 PM  [place] [address] [place], [place], [address] Phone: [phone]   Fax:  [phone]  Name: Lori Stout MRN: [id number] Date of Birth: 1961-03-30

## 2019-11-03 ENCOUNTER — Ambulatory Visit: Payer: 59 | Attending: Adult Health Nurse Practitioner

## 2019-11-03 ENCOUNTER — Other Ambulatory Visit: Payer: Self-pay

## 2019-11-03 DIAGNOSIS — M542 Cervicalgia: Secondary | ICD-10-CM | POA: Insufficient documentation

## 2019-11-03 DIAGNOSIS — R252 Cramp and spasm: Secondary | ICD-10-CM | POA: Insufficient documentation

## 2019-11-03 DIAGNOSIS — M6281 Muscle weakness (generalized): Secondary | ICD-10-CM | POA: Diagnosis present

## 2019-11-03 NOTE — Therapy (Signed)
[place], [place], [address] Phone: [phone]   Fax:  [phone]  Physical Therapy Treatment  Patient Details  Name: Lori Stout MRN: [id number] Date of Birth: 06/06/1960 Referring Provider (PT): Lars Mage   Encounter Date: 11/03/2019   PT End of Session - 11/03/19 1406    Visit Number 10    Date for PT Re-Evaluation 11/20/19    PT Start Time 1400    PT Stop Time 1445    PT Time Calculation (min) 45 min    Activity Tolerance Patient tolerated treatment well    Behavior During Therapy [facility] for tasks assessed/performed           Past Medical History:  Diagnosis Date  . Allergy   . Arthritis   . Kidney stone   . Urinary tract infection     Past Surgical History:  Procedure Laterality Date  . CESAREAN SECTION    . COSMETIC SURGERY  2006   breast implants  . EYE SURGERY      There were no vitals filed for this visit.   Subjective Assessment - 11/03/19 1403    Subjective Pt reports she was supposed to come Tuesday, but she had a dental appt and could not come. She has had a lot of pain in the left side of her neck and issues with B carpal tunnel. She "almost dropped a gallon of water with her left hand this week".    Currently in Pain? Yes    Pain Score 7     Pain Location Neck    Pain Orientation Left                             [facility] Adult PT Treatment/Exercise - 11/03/19 0001      Neck Exercises: Machines for Strengthening   UBE (Upper Arm Bike) L4 3 min fwd/3 min bkwd      Neck Exercises: Standing   Other Standing Exercises Standing posture corrective x 3 min      Neck Exercises: Supine   Other Supine Exercise Self STM to SO on FR    Other Supine Exercise FR T/S ext 2 x 10 b/t shoulder blades and just above      Neck Exercises: Sidelying   Other Sidelying Exercise Thoracic Rot'n x 10 B   some R PSIS pain with R rotation     Neck  Exercises: Prone   Other Prone Exercise T/S book openers x 15 HBH      Manual Therapy   Manual Therapy Joint mobilization;Soft tissue mobilization;Passive ROM;Manual Traction    Joint Mobilization L mid cervical side glides    Soft tissue mobilization STM to L C/S PS, SO    Passive ROM PROM R lateral flexion to S UT, L rotation    Manual Traction c/s traction                    PT Short Term Goals - 09/20/19 1804      PT SHORT TERM GOAL #1   Title Pt will be independent with HEP    Time 2    Period Weeks    Status New    Target Date 10/04/19             PT Long Term Goals - 10/13/19 1412      PT LONG TERM GOAL #1   Title  Pt will demonstrate cervical extension/lateral flexion WFL with no complaints of increased cervical pain    Time 6    Period Weeks    Status On-going      PT LONG TERM GOAL #2   Title Pt will report resolution of radiating pain into L UE    Time 6    Period Weeks    Status Achieved      PT LONG TERM GOAL #3   Title Pt will demonstrate L grip strength equivalent to R    Time 6    Period Weeks    Status Partially Met      PT LONG TERM GOAL #4   Title Pt will report reduction in pain by 50%    Time 6    Period Weeks    Status On-going      PT LONG TERM GOAL #5   Title Pt will report able to work in garden for >1 hour with no increase in neck pain    Time 6    Period Weeks    Status Achieved                 Plan - 11/03/19 1406    Clinical Impression Statement Pt presented with increased pain and decreased tolerance to strenghtening today. Downgraded to slightly more manual therapy, incoporating foam roller wiht good response. Pt reported she wanted to buy one when she left her session. Pt did report decr in neck stiffness and improvement in mobility following session.    PT Treatment/Interventions ADLs/Self Care Home Management;Electrical Stimulation;Traction;Moist Heat;Iontophoresis 62m/ml Dexamethasone;Therapeutic  activities;Therapeutic exercise;Neuromuscular re-education;Manual techniques;Patient/family education;Passive range of motion;Dry needling;Taping    PT Next Visit Plan cervical ROM/flexibility/strengthening, UE strengthening/flexibility, focus on cervical/shoulder corrective posturing    Consulted and Agree with Plan of Care Patient           Patient will benefit from skilled therapeutic intervention in order to improve the following deficits and impairments:  Decreased range of motion, Increased muscle spasms, Impaired UE functional use, Pain, Impaired flexibility  Visit Diagnosis: Neck pain  Cramp and spasm  Muscle weakness (generalized)     Problem List Patient Active Problem List   Diagnosis Date Noted  . Spinal stenosis of lumbar region 01/09/2013  . Flank pain 01/07/2013  . Hematuria 01/07/2013    Lori Stout PT, DPT 11/03/2019, 3:46 PM  [address] [address]Phone: [phone]  Fax:  [phone] Name: Lori WHITCHERMRN: [id number] of Birth: 6October 24, 1962

## 2019-11-08 ENCOUNTER — Encounter: Payer: Self-pay | Admitting: Physical Therapy

## 2019-11-08 ENCOUNTER — Other Ambulatory Visit: Payer: Self-pay

## 2019-11-08 ENCOUNTER — Ambulatory Visit: Payer: 59 | Admitting: Physical Therapy

## 2019-11-08 DIAGNOSIS — M542 Cervicalgia: Secondary | ICD-10-CM | POA: Diagnosis not present

## 2019-11-08 DIAGNOSIS — R252 Cramp and spasm: Secondary | ICD-10-CM

## 2019-11-08 DIAGNOSIS — M6281 Muscle weakness (generalized): Secondary | ICD-10-CM

## 2019-11-08 NOTE — Therapy (Signed)
[place], [place], [address] Phone: [phone]   Fax:  [phone]  Physical Therapy Treatment  Patient Details  Name: Lori Stout MRN: [id number] Date of Birth: 1960-11-23 Referring Provider (PT): Lars Mage   Encounter Date: 11/08/2019   PT End of Session - 11/08/19 1522    Visit Number 11    Date for PT Re-Evaluation 11/20/19    PT Start Time 1452    PT Stop Time 1538    PT Time Calculation (min) 46 min    Activity Tolerance Patient tolerated treatment well    Behavior During Therapy [facility] for tasks assessed/performed           Past Medical History:  Diagnosis Date  . Allergy   . Arthritis   . Kidney stone   . Urinary tract infection     Past Surgical History:  Procedure Laterality Date  . CESAREAN SECTION    . COSMETIC SURGERY  2006   breast implants  . EYE SURGERY      There were no vitals filed for this visit.   Subjective Assessment - 11/08/19 1521    Subjective Patient will be having surgery next week for CT.  She is reporting stiffness and pain in her neck    Currently in Pain? Yes    Pain Score 6     Pain Location Neck    Aggravating Factors  stress                             [facility] Adult PT Treatment/Exercise - 11/08/19 0001      Moist Heat Therapy   Number Minutes Moist Heat 10 Minutes    Moist Heat Location Cervical      Electrical Stimulation   Electrical Stimulation Location Cervical    Electrical Stimulation Action IFC    Electrical Stimulation Parameters supine    Electrical Stimulation Goals Pain      Manual Therapy   Manual Therapy Joint mobilization;Soft tissue mobilization;Passive ROM;Manual Traction    Soft tissue mobilization to the cervical pspinals and the upper traps    Passive ROM PROM with some contract relax    Manual Traction occipital release                    PT Short Term Goals - 09/20/19  1804      PT SHORT TERM GOAL #1   Title Pt will be independent with HEP    Time 2    Period Weeks    Status New    Target Date 10/04/19             PT Long Term Goals - 11/08/19 1524      PT LONG TERM GOAL #1   Title Pt will demonstrate cervical extension/lateral flexion WFL with no complaints of increased cervical pain    Status Partially Met      PT LONG TERM GOAL #2   Title Pt will report resolution of radiating pain into L UE    Status Achieved      PT LONG TERM GOAL #3   Title Pt will demonstrate L grip strength equivalent to R    Status Partially Met      PT LONG TERM GOAL #4   Title Pt will report reduction in pain by 50%    Status On-going  Plan - 11/08/19 1523    Clinical Impression Statement Patient still very tender and stiff in the cervical area, she seemed to do well with the contract relax to gain ROM, as she is very limited in rotation and side bending.  She will be having CT surgery next week    PT Next Visit Plan cervical ROM/flexibility/strengthening, UE strengthening/flexibility, focus on cervical/shoulder corrective posturing, see what happens with the surgery    Consulted and Agree with Plan of Care Patient           Patient will benefit from skilled therapeutic intervention in order to improve the following deficits and impairments:  Decreased range of motion, Increased muscle spasms, Impaired UE functional use, Pain, Impaired flexibility  Visit Diagnosis: Neck pain  Cramp and spasm  Muscle weakness (generalized)     Problem List Patient Active Problem List   Diagnosis Date Noted  . Spinal stenosis of lumbar region 01/09/2013  . Flank pain 01/07/2013  . Hematuria 01/07/2013    Sumner Boast., PT 11/08/2019, 3:28 PM  [place] [address] [place], [place], [address] Phone: [phone]   Fax:  [phone]  Name: Lori Stout MRN:  [id number] Date of Birth: 1961-03-20

## 2020-01-05 IMAGING — DX LEFT ELBOW - COMPLETE 3+ VIEW
4 series · 4 of 4 positions shown · non-contrast
Comparison: None.

CLINICAL DATA: Injury and pain.

EXAM:
LEFT ELBOW - COMPLETE 3+ VIEW

[elbow ap (1 of 3)]
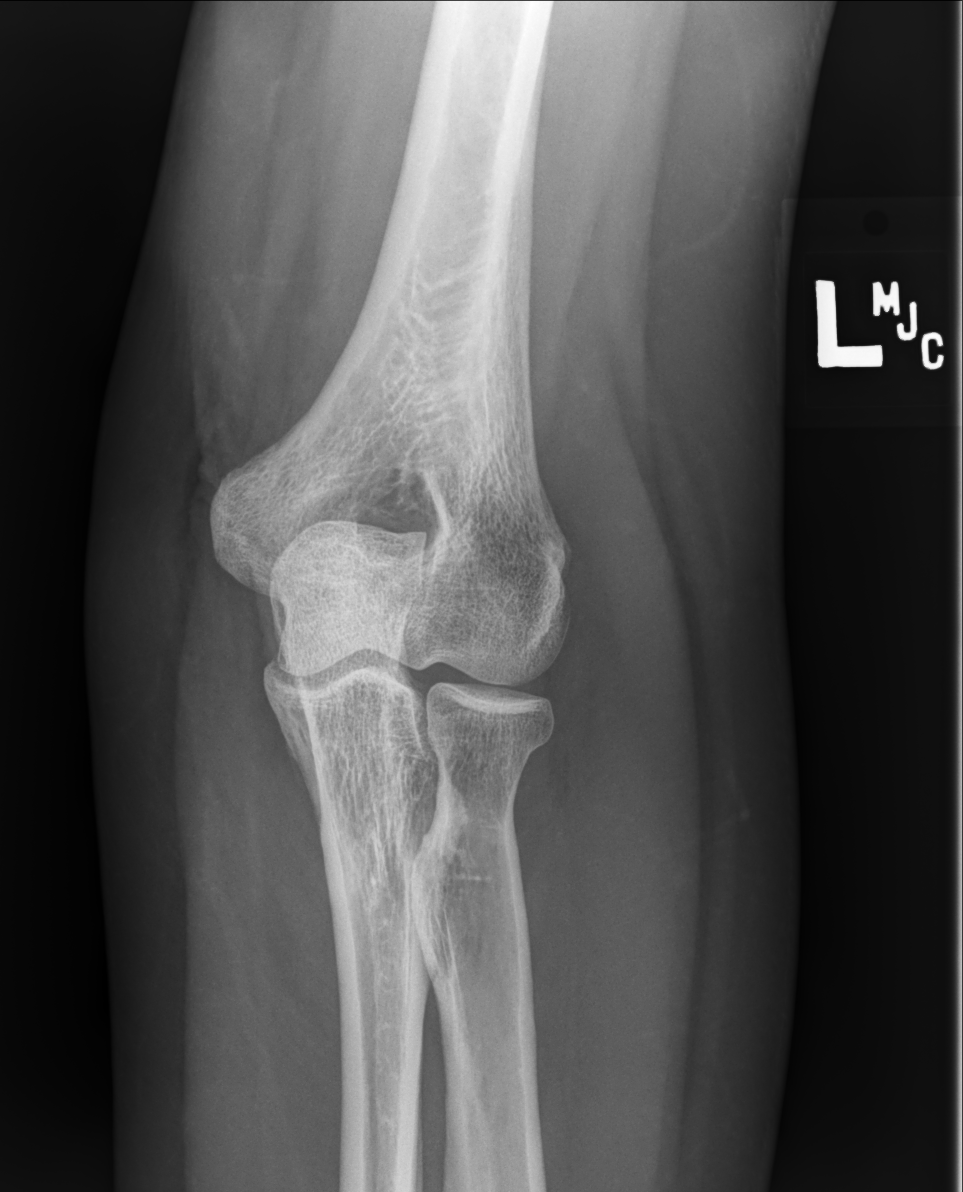

[elbow ap (2 of 3)]
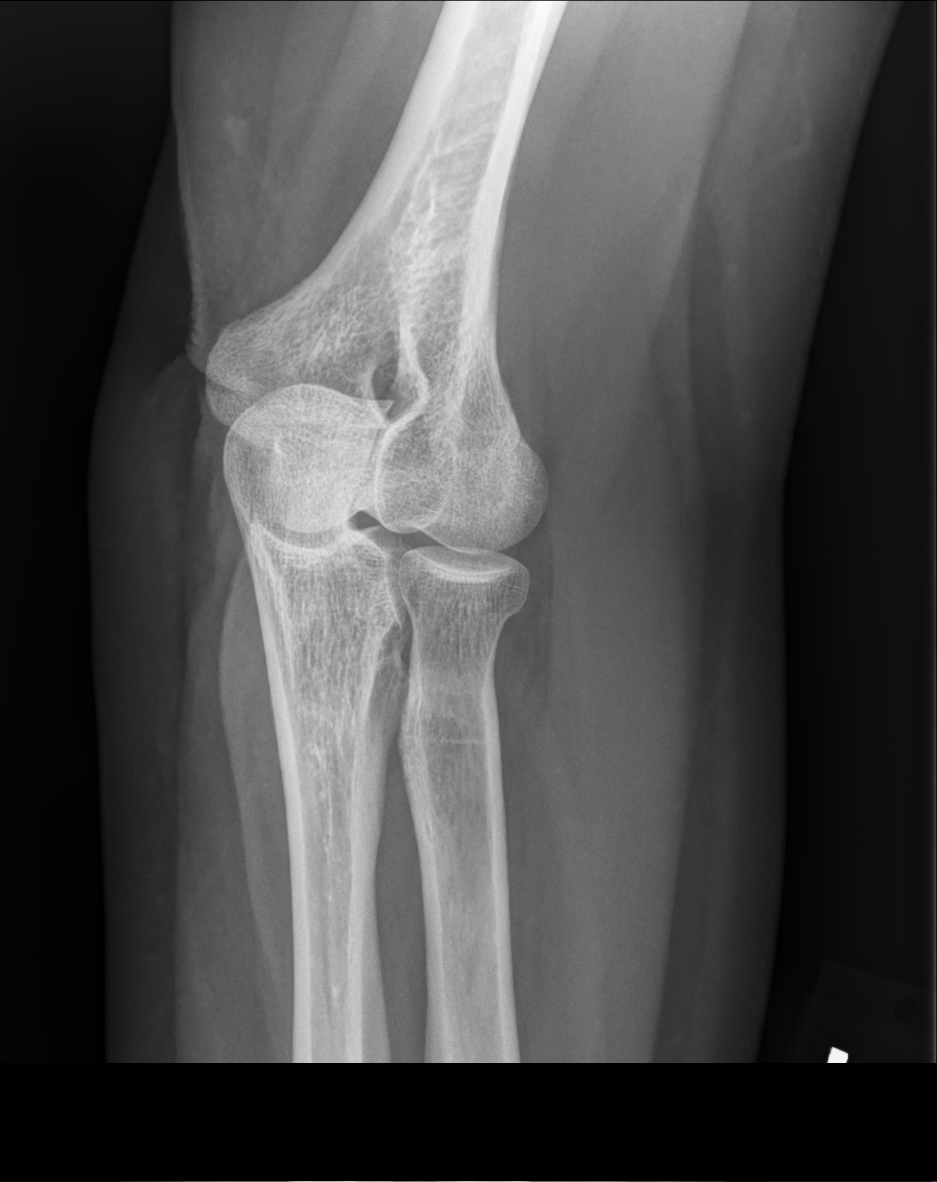

[elbow ap (3 of 3)]
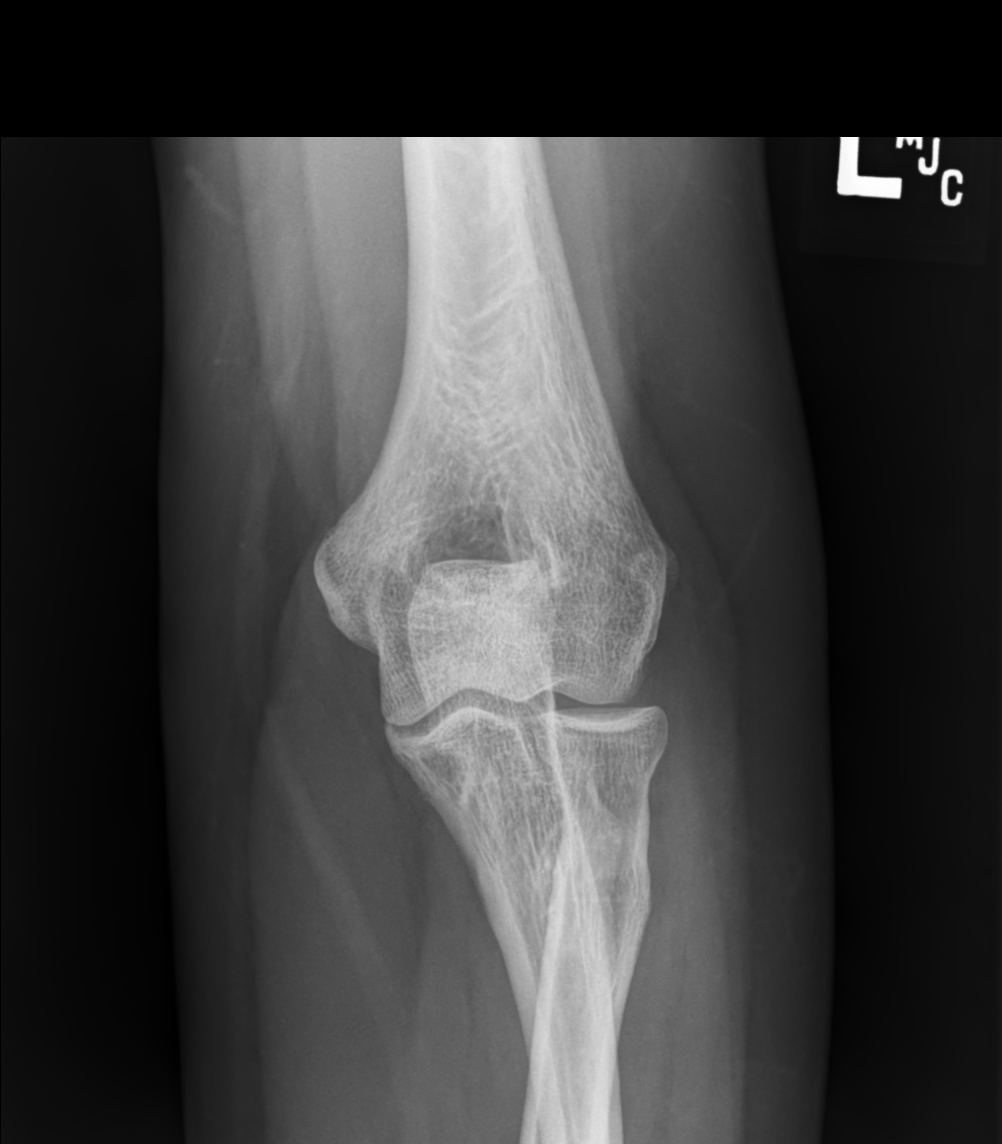

[elbow lat]
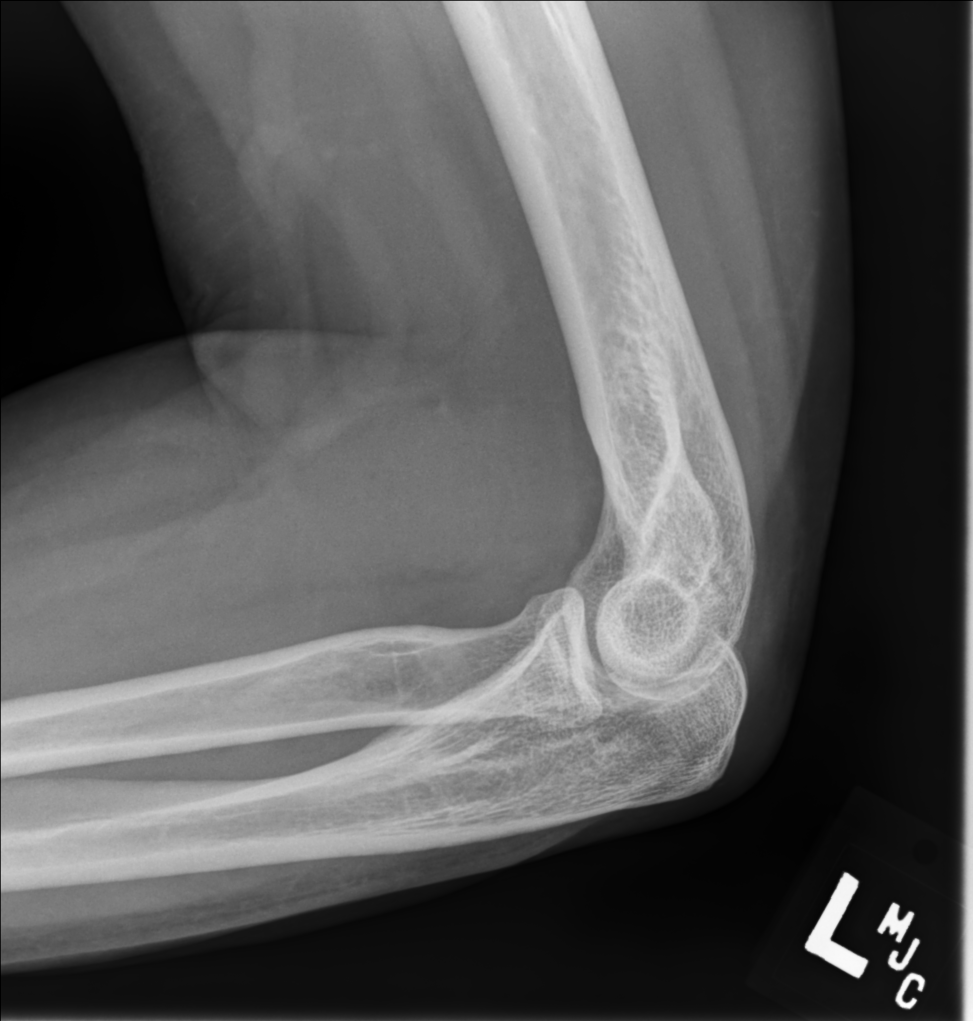

[4 of 4 positions shown; findings below may reference images not displayed]

FINDINGS: There is no evidence of fracture, dislocation, or joint effusion.
There is no evidence of arthropathy or other focal bone abnormality.
Soft tissues are unremarkable.
IMPRESSION: Negative.
